# Patient Record
Sex: Male | Born: 1950 | Race: Black or African American | Hispanic: No | Marital: Married
Health system: Midwestern US, Academic
[De-identification: ages and names within clinical notes are randomized; demographics above are authoritative.]

---

## 2016-08-04 ENCOUNTER — Encounter: Admit: 2016-08-04 | Discharge: 2016-08-05 | Payer: MEDICARE

## 2016-08-04 DIAGNOSIS — R69 Illness, unspecified: Principal | ICD-10-CM

## 2016-08-09 ENCOUNTER — Encounter: Admit: 2016-08-09 | Discharge: 2016-08-10 | Payer: MEDICARE

## 2016-08-09 DIAGNOSIS — R69 Illness, unspecified: Principal | ICD-10-CM

## 2016-08-17 ENCOUNTER — Encounter: Admit: 2016-08-17 | Discharge: 2016-08-17 | Payer: MEDICARE

## 2016-08-17 ENCOUNTER — Ambulatory Visit: Admit: 2016-08-17 | Discharge: 2016-08-18 | Payer: MEDICARE

## 2016-08-17 DIAGNOSIS — I10 Essential (primary) hypertension: Principal | ICD-10-CM

## 2016-08-17 DIAGNOSIS — G20 Parkinson's disease: ICD-10-CM

## 2016-08-17 NOTE — Assessment & Plan Note
WHile he had improvement of his symptoms he was unable to tolerate CD/LD due to headaches and remains just on benztropine. He will remain on this and return to see me again in six months.

## 2016-08-17 NOTE — Progress Notes
Date of Service: 08/17/2016    Subjective:             Mitchell Burnett is a 66 y.o. male.    History of Present Illness    He returns today, accompanied by his wife, for follow up of his PD. He had improvement of his symptoms on CD/LD yet had headaches severe enough that he stopped the CD/LD. While on it he did not have problems with wearing off or of dyskinesias. He denies any sleep or behavior problems. He has rae falls at this time. Her tremor is well controlled on the benztropine and he denies any ill effects.    Review of Systems   Constitutional: Positive for activity change.   HENT: Negative.    Eyes: Negative.    Respiratory: Negative.    Cardiovascular: Negative.    Gastrointestinal: Negative.    Endocrine: Negative.    Genitourinary: Negative.    Musculoskeletal: Positive for back pain.        See a neurosurgeon later this month   Skin: Negative.    Allergic/Immunologic: Negative.    Neurological: Positive for tremors.   Hematological: Negative.    Psychiatric/Behavioral: Negative.    All other systems reviewed and are negative.    Medications:  ??? aspirin EC 81 mg tablet Take 81 mg by mouth daily. Take with food.   ??? benztropine (COGENTIN) 0.5 mg tablet TAKE ONE TABLET BY MOUTH TWICE DAILY FOR  TREMOR   ??? benztropine (COGENTIN) 0.5 mg tablet TAKE ONE TABLET BY MOUTH TWICE DAILY FOR  TREMOR   ??? benztropine (COGENTIN) 0.5 mg tablet One three times a day for tremor   ??? carbidopa/levodopa (SINEMET) 25/100 mg tablet 1 three times a day, just before meals Stopped in March 2018 due to headaches   ??? CORTISONE ACETATE (CORTISONE IM) Inject  to area(s) as directed as Needed. Patient receives these shots in the L and R shoulders   ??? cyclobenzaprine (FLEXERIL) 10 mg tablet Take 10 mg by mouth daily as needed.   ??? docusate (COLACE) 100 mg capsule Take 1 Cap by mouth daily as needed for Constipation.   ??? doxazosin (CARDURA) 8 mg tablet Take 8 mg by mouth at bedtime daily. ??? hydroCHLOROthiazide (HYDRODIURIL) 25 mg tablet Take 25 mg by mouth every morning.   ??? ibuprofen (MOTRIN) 800 mg tablet Take 1 Tab by mouth every 8 hours as needed for Pain.   ??? meloxicam (MOBIC) 7.5 mg tablet Take 15 mg by mouth daily.   ??? sildenafil(+) (VIAGRA) 50 mg tablet Take 1 Tab by mouth as Needed for Erectile dysfunction.   ??? vitamins, multiple (MULTIPLE VITAMINS DAILY) tablet Take 1 Tab by mouth daily.         Objective:           Vitals:    08/17/16 1026   BP: 129/80   Pulse: 71   Weight: 79.4 kg (175 lb)   Height: 180.3 cm (71)     Body mass index is 24.41 kg/m???.     Physical Exam    Well developed, muscular male accompanied by his wife.    UPDRS  Mentation, Behavior and Mood:    Primary source of information:: Patient and [place] in Equal Proportion  Intellectual Impairment: Normal  Thought Disorder: Normal  Depression: Normal  Motivation / Initiative: Normal  Total Mentation Score: 0      Activities of Daily Living:    Speech: Normal  Salivation: Normal  Swallowing: Normal  Handwriting: Normal  Cutting Food and Handling Utensils: Normal  Dressing: Normal  Turning in Bed and Adjusting Bed Clothes: Normal  Falling (Unrelated to Freezing): Normal  Freezing when Walking: Normal  Walking: Slight  Tremor (Right): Slight  Tremor (Left): Normal  Sensory Complaints Related to Parkinsonism: Normal  Total Activities of Daily Living Score: 2    Motor Examination:    Speech: Slight  Facial Expression: Slight  Tremor at Rest Jaw: Normal  Tremor at Rest RUE: Slight  Tremor at Rest LUE: Normal  Tremor at Rest RLE: Normal  Tremor at Rest LLE: Normal  Action of Postural Tremor of Hands R: Normal  Action of Postural Tremor of Hands L: Normal  Rigidity NECK: Normal  Rigidity RUE: Slight  Rigidity LUE: Normal  Rigidity RLE: Normal  Rigidity LLE: Normal  Finger Taps L: Normal  Hand Movements R: Slight  Hand Movements L: Normal  Rapid Alternating Movement of Hands R: Slight  Rapid Alternating Movements of Hands L: Normal Leg Agility with Knee Bent R: Normal  Leg Agility with Knee Bent L: Normal  Arising From Chair: Normal  Posture: Normal  Gait: Slight  Postural Stability: Normal (Not tesed due to recent back problems)  Body Bradykinesia and Hypokinesia: Slight  Total Motor Exam: 9    UPDRS Total Score:    Total Mentation Score: 0  Total Activities of Daily Living Score: 2  Total Motor Exam: 9  Total UPDRS Score: 11    Part 4:    Dyskinesia Duration: is 0%  Dyskinesia Disability: is 0%  Painful Dyskinesia: is 0%  Early AM Dystonia: is 0%  Predictable off period: is 0%  Unpredictable off period: is 0%  Off period suddenly: is 0%  Duration of off periods: is 0%  Total Part IV: 0  On Medications?: ON medications (benztropine only)    Additional Scores:    H&Y Stage: 1  [place] and [place] Percent Disability: 100 %         Assessment and Plan:    Problem   Parkinson Disease (Hcc)    Between his last visit in 2014 and January 2017 he developed right hand bradykinesia and increased tone making the diagnosis of PD clear. He has experienced side effects, primarily severe headaches, nausea, vomiting and hallucinations with various dopaminergic agents including CD/LD, Azilect, Neurpro and ropinirole.     He remains incredibly active and this has been the best treatment for him.            Parkinson disease (HCC)  WHile he had improvement of his symptoms he was unable to tolerate CD/LD due to headaches and remains just on benztropine. He will remain on this and return to see me again in six months.

## 2016-08-18 DIAGNOSIS — G20 Parkinson's disease: Principal | ICD-10-CM

## 2016-09-15 ENCOUNTER — Encounter: Admit: 2016-09-15 | Discharge: 2016-09-15 | Payer: MEDICARE

## 2016-09-15 MED ORDER — BENZTROPINE 0.5 MG PO TAB
ORAL_TABLET | Freq: Three times a day (TID) | ORAL | 5 refills | 30.00000 days | Status: AC
Start: 2016-09-15 — End: 2017-03-16

## 2017-02-17 ENCOUNTER — Ambulatory Visit: Admit: 2017-02-17 | Discharge: 2017-02-18 | Payer: MEDICARE

## 2017-02-17 ENCOUNTER — Encounter: Admit: 2017-02-17 | Discharge: 2017-02-17 | Payer: MEDICARE

## 2017-02-17 DIAGNOSIS — I10 Essential (primary) hypertension: Principal | ICD-10-CM

## 2017-02-17 DIAGNOSIS — R69 Illness, unspecified: Principal | ICD-10-CM

## 2017-02-17 DIAGNOSIS — G20 Parkinson's disease: ICD-10-CM

## 2017-02-18 DIAGNOSIS — M545 Low back pain: Principal | ICD-10-CM

## 2017-02-21 ENCOUNTER — Encounter: Admit: 2017-02-21 | Discharge: 2017-02-21 | Payer: MEDICARE

## 2017-02-21 ENCOUNTER — Ambulatory Visit: Admit: 2017-02-21 | Discharge: 2017-02-22 | Payer: MEDICARE

## 2017-02-21 ENCOUNTER — Ambulatory Visit: Admit: 2017-02-21 | Discharge: 2017-02-21 | Payer: MEDICARE

## 2017-02-21 DIAGNOSIS — M5416 Radiculopathy, lumbar region: Principal | ICD-10-CM

## 2017-03-16 ENCOUNTER — Encounter: Admit: 2017-03-16 | Discharge: 2017-03-16 | Payer: MEDICARE

## 2017-03-16 ENCOUNTER — Ambulatory Visit: Admit: 2017-03-16 | Discharge: 2017-03-17 | Payer: MEDICARE

## 2017-03-16 DIAGNOSIS — G20 Parkinson's disease: ICD-10-CM

## 2017-03-16 DIAGNOSIS — I10 Essential (primary) hypertension: Principal | ICD-10-CM

## 2017-03-16 MED ORDER — CARBIDOPA-LEVODOPA 25-100 MG PO TAB
ORAL_TABLET | Freq: Three times a day (TID) | 3 refills | Status: AC
Start: 2017-03-16 — End: 2017-09-13

## 2017-03-16 MED ORDER — BENZTROPINE 0.5 MG PO TAB
ORAL_TABLET | Freq: Two times a day (BID) | ORAL | 3 refills | 30.00000 days | Status: AC
Start: 2017-03-16 — End: 2018-05-05

## 2017-03-16 MED ORDER — NABUMETONE 750 MG PO TAB
ORAL_TABLET | Freq: Every day | ORAL | 3 refills | 30.00000 days | Status: AC
Start: 2017-03-16 — End: 2018-04-03

## 2017-03-17 DIAGNOSIS — G20 Parkinson's disease: Principal | ICD-10-CM

## 2017-03-17 DIAGNOSIS — M48061 Spinal stenosis, lumbar region without neurogenic claudication: ICD-10-CM

## 2017-05-16 ENCOUNTER — Ambulatory Visit: Admit: 2017-05-16 | Discharge: 2017-05-17 | Payer: MEDICARE

## 2017-05-16 ENCOUNTER — Encounter: Admit: 2017-05-16 | Discharge: 2017-05-16 | Payer: MEDICARE

## 2017-05-16 ENCOUNTER — Ambulatory Visit: Admit: 2017-05-16 | Discharge: 2017-05-16 | Payer: MEDICARE

## 2017-05-16 DIAGNOSIS — M5416 Radiculopathy, lumbar region: Principal | ICD-10-CM

## 2017-05-17 ENCOUNTER — Encounter: Admit: 2017-05-17 | Discharge: 2017-05-17 | Payer: MEDICARE

## 2017-08-23 ENCOUNTER — Ambulatory Visit: Admit: 2017-08-23 | Discharge: 2017-08-24 | Payer: MEDICARE

## 2017-08-23 ENCOUNTER — Encounter: Admit: 2017-08-23 | Discharge: 2017-08-23 | Payer: MEDICARE

## 2017-08-23 DIAGNOSIS — M5136 Other intervertebral disc degeneration, lumbar region: Secondary | ICD-10-CM

## 2017-08-23 DIAGNOSIS — G20 Parkinson's disease: ICD-10-CM

## 2017-08-23 DIAGNOSIS — I10 Essential (primary) hypertension: Principal | ICD-10-CM

## 2017-08-23 MED ORDER — GABAPENTIN 100 MG PO CAP
ORAL_CAPSULE | 0 refills | Status: AC
Start: 2017-08-23 — End: 2017-10-11

## 2017-08-24 DIAGNOSIS — M47816 Spondylosis without myelopathy or radiculopathy, lumbar region: ICD-10-CM

## 2017-08-24 DIAGNOSIS — M5416 Radiculopathy, lumbar region: Principal | ICD-10-CM

## 2017-09-05 ENCOUNTER — Encounter: Admit: 2017-09-05 | Discharge: 2017-09-05 | Payer: MEDICARE

## 2017-09-05 ENCOUNTER — Ambulatory Visit: Admit: 2017-09-05 | Discharge: 2017-09-05 | Payer: MEDICARE

## 2017-09-05 DIAGNOSIS — M5416 Radiculopathy, lumbar region: Principal | ICD-10-CM

## 2017-09-05 DIAGNOSIS — M5136 Other intervertebral disc degeneration, lumbar region: ICD-10-CM

## 2017-09-05 DIAGNOSIS — M47816 Spondylosis without myelopathy or radiculopathy, lumbar region: ICD-10-CM

## 2017-09-05 MED ORDER — BUPIVACAINE (PF) 0.25 % (2.5 MG/ML) IJ SOLN
2 mL | Freq: Once | INTRAMUSCULAR | 0 refills | Status: CP
Start: 2017-09-05 — End: ?

## 2017-09-05 MED ORDER — METHYLPREDNISOLONE ACETATE 80 MG/ML IJ SUSP
80 mg | Freq: Once | INTRAMUSCULAR | 0 refills | Status: CP
Start: 2017-09-05 — End: ?

## 2017-09-05 MED ORDER — IOPAMIDOL 41 % IT SOLN
2.5 mL | Freq: Once | EPIDURAL | 0 refills | Status: CP
Start: 2017-09-05 — End: ?

## 2017-09-13 ENCOUNTER — Ambulatory Visit: Admit: 2017-09-13 | Discharge: 2017-09-14 | Payer: MEDICARE

## 2017-09-13 ENCOUNTER — Encounter: Admit: 2017-09-13 | Discharge: 2017-09-13 | Payer: MEDICARE

## 2017-09-13 DIAGNOSIS — G20 Parkinson's disease: ICD-10-CM

## 2017-09-13 DIAGNOSIS — I10 Essential (primary) hypertension: Principal | ICD-10-CM

## 2017-09-13 MED ORDER — CARBIDOPA-LEVODOPA 25-100 MG PO TAB
ORAL_TABLET | Freq: Three times a day (TID) | 3 refills | Status: AC
Start: 2017-09-13 — End: 2018-05-26

## 2017-09-14 DIAGNOSIS — G20 Parkinson's disease: Principal | ICD-10-CM

## 2017-09-19 ENCOUNTER — Encounter: Admit: 2017-09-19 | Discharge: 2017-09-19 | Payer: MEDICARE

## 2017-10-11 ENCOUNTER — Encounter: Admit: 2017-10-11 | Discharge: 2017-10-11 | Payer: MEDICARE

## 2017-10-11 MED ORDER — GABAPENTIN 100 MG PO CAP
ORAL_CAPSULE | 3 refills | Status: AC
Start: 2017-10-11 — End: 2018-04-21

## 2017-11-07 ENCOUNTER — Encounter: Admit: 2017-11-07 | Discharge: 2017-11-07 | Payer: MEDICARE

## 2017-11-07 ENCOUNTER — Ambulatory Visit: Admit: 2017-11-07 | Discharge: 2017-11-07 | Payer: MEDICARE

## 2017-11-07 DIAGNOSIS — M5416 Radiculopathy, lumbar region: Principal | ICD-10-CM

## 2017-11-07 DIAGNOSIS — G20 Parkinson's disease: ICD-10-CM

## 2017-11-07 DIAGNOSIS — I10 Essential (primary) hypertension: Principal | ICD-10-CM

## 2017-11-07 MED ORDER — BUPIVACAINE 0.25 % (2.5 MG/ML) IJ SOLN
50 mL | Freq: Once | INTRAMUSCULAR | 0 refills | Status: CP
Start: 2017-11-07 — End: ?

## 2017-11-07 MED ORDER — METHYLPREDNISOLONE ACETATE 40 MG/ML IJ SUSP
80 mg | Freq: Once | INTRAMUSCULAR | 0 refills | Status: CP
Start: 2017-11-07 — End: ?

## 2017-11-07 MED ORDER — OTHER MEDICATION
1 | Freq: Once | INTRAMUSCULAR | 0 refills | Status: CP
Start: 2017-11-07 — End: ?

## 2018-02-02 ENCOUNTER — Encounter: Admit: 2018-02-02 | Discharge: 2018-02-02 | Payer: MEDICARE

## 2018-02-02 DIAGNOSIS — M5416 Radiculopathy, lumbar region: Principal | ICD-10-CM

## 2018-02-20 ENCOUNTER — Encounter: Admit: 2018-02-20 | Discharge: 2018-02-20 | Payer: MEDICARE

## 2018-02-20 ENCOUNTER — Ambulatory Visit: Admit: 2018-02-20 | Discharge: 2018-02-20 | Payer: MEDICARE

## 2018-02-20 DIAGNOSIS — M5416 Radiculopathy, lumbar region: Secondary | ICD-10-CM

## 2018-02-20 DIAGNOSIS — M25572 Pain in left ankle and joints of left foot: Secondary | ICD-10-CM

## 2018-02-20 DIAGNOSIS — G20 Parkinson's disease: Secondary | ICD-10-CM

## 2018-02-20 DIAGNOSIS — I10 Essential (primary) hypertension: Secondary | ICD-10-CM

## 2018-03-20 ENCOUNTER — Encounter: Admit: 2018-03-20 | Discharge: 2018-03-20 | Payer: MEDICARE

## 2018-03-20 DIAGNOSIS — M25572 Pain in left ankle and joints of left foot: Principal | ICD-10-CM

## 2018-03-21 ENCOUNTER — Ambulatory Visit: Admit: 2018-03-21 | Discharge: 2018-03-21 | Payer: MEDICARE

## 2018-03-21 ENCOUNTER — Encounter: Admit: 2018-03-21 | Discharge: 2018-03-21 | Payer: MEDICARE

## 2018-03-21 DIAGNOSIS — M25572 Pain in left ankle and joints of left foot: Principal | ICD-10-CM

## 2018-03-21 DIAGNOSIS — M76829 Posterior tibial tendinitis, unspecified leg: ICD-10-CM

## 2018-03-21 DIAGNOSIS — G8929 Other chronic pain: ICD-10-CM

## 2018-03-21 DIAGNOSIS — I10 Essential (primary) hypertension: Principal | ICD-10-CM

## 2018-03-21 DIAGNOSIS — G20 Parkinson's disease: ICD-10-CM

## 2018-03-21 NOTE — Progress Notes
Staff name:  Abran Duke, MD Date:  03/21/2018          Abran Duke, MD    Portions of this noted may have been created using Dragon, a voice recognition software.  Please contact my office for any clarification of documentation    Please send a copy of office notes to the primary care physician and referring providers

## 2018-04-03 ENCOUNTER — Encounter: Admit: 2018-04-03 | Discharge: 2018-04-03 | Payer: MEDICARE

## 2018-04-03 MED ORDER — NABUMETONE 750 MG PO TAB
ORAL_TABLET | Freq: Every day | ORAL | 1 refills | 30.00000 days | Status: AC
Start: 2018-04-03 — End: 2018-10-13

## 2018-04-21 ENCOUNTER — Encounter: Admit: 2018-04-21 | Discharge: 2018-04-21 | Payer: MEDICARE

## 2018-04-21 MED ORDER — GABAPENTIN 100 MG PO CAP
ORAL_CAPSULE | 3 refills | Status: AC
Start: 2018-04-21 — End: 2018-05-26

## 2018-05-03 ENCOUNTER — Encounter: Admit: 2018-05-03 | Discharge: 2018-05-03 | Payer: MEDICARE

## 2018-05-05 MED ORDER — BENZTROPINE 0.5 MG PO TAB
ORAL_TABLET | Freq: Two times a day (BID) | ORAL | 1 refills | 30.00000 days | Status: AC
Start: 2018-05-05 — End: 2018-12-04

## 2018-05-16 ENCOUNTER — Encounter: Admit: 2018-05-16 | Discharge: 2018-05-16 | Payer: MEDICARE

## 2018-05-26 ENCOUNTER — Ambulatory Visit: Admit: 2018-05-26 | Discharge: 2018-05-27 | Payer: MEDICARE

## 2018-05-26 ENCOUNTER — Encounter: Admit: 2018-05-26 | Discharge: 2018-05-26 | Payer: MEDICARE

## 2018-05-26 DIAGNOSIS — M48062 Spinal stenosis, lumbar region with neurogenic claudication: ICD-10-CM

## 2018-05-26 DIAGNOSIS — I10 Essential (primary) hypertension: Principal | ICD-10-CM

## 2018-05-26 DIAGNOSIS — G20 Parkinson's disease: ICD-10-CM

## 2018-05-26 MED ORDER — CARBIDOPA-LEVODOPA 25-100 MG PO TAB
ORAL_TABLET | Freq: Three times a day (TID) | 3 refills | Status: DC
Start: 2018-05-26 — End: 2019-07-10

## 2018-05-26 MED ORDER — GABAPENTIN 100 MG PO CAP
ORAL_CAPSULE | Freq: Three times a day (TID) | 1 refills | Status: DC
Start: 2018-05-26 — End: 2018-09-25

## 2018-05-26 NOTE — Progress Notes
Telephone Visit Note    Date of Service: 05/26/2018    Subjective:      Obtained patient's verbal consent to treat them and their agreement to [facility] financial policy and NPP via this telehealth visit during the [facility]       Mitchell Burnett is a 68 y.o. male.    History of Present Illness    This telephone visit was for follow up of his PD and chronic low back pain. He is less active now with the shelter in place order. He notices wearing off of his CD/LD which he takes morning and at bedtime and his wife has noticed some tremor at night. He has not had any dyskinesias or behavior problems and has had only one fall.     Review of Systems   Neurological: Positive for tremors (evening worsening).   All other systems reviewed and are negative.    Medications:  ??? benztropine (COGENTIN) 0.5 mg tablet TAKE 1 TABLET BY MOUTH TWICE DAILY FOR  TREMOR   ??? carbidopa/levodopa (SINEMET) 25/100 mg tablet One and 1/2 pills three times a day, just before meals (Patient taking differently: One and 1/2 pills twice times a day, just before meals)   ??? CORTISONE ACETATE (CORTISONE IM) Inject  to area(s) as directed as Needed. Patient receives these shots in the L and R shoulders   ??? doxazosin (CARDURA) 8 mg tablet Take 8 mg by mouth at bedtime daily.   ??? gabapentin (NEURONTIN) 100 mg capsule 1 po qhs x 1 week then 2 po QHS x 1 week then 3 po QHS thereafter  Indications: neuropathic pain (Patient taking differently: Take 100 mg by mouth twice daily. 1 po qhs x 1 week then 2 po QHS x 1 week then 3 po QHS thereafter  Indications: neuropathic pain)   ??? hydroCHLOROthiazide (HYDRODIURIL) 25 mg tablet Take 25 mg by mouth every morning.   ??? nabumetone (RELAFEN) 750 mg tablet TAKE 2 TABLETS BY MOUTH DAILY *REPLACES MELOXICAM*   ??? rosuvastatin (CRESTOR) 10 mg tablet Take 10 mg by mouth daily.   ??? sildenafil(+) (VIAGRA) 50 mg tablet Take 1 Tab by mouth as Needed for Erectile dysfunction.       Objective: Order Specific Question:   Collaborating Provider     Answer:   Rudell Cobb [[id number]]                  Total time 20 minutes.

## 2018-05-26 NOTE — Assessment & Plan Note
While he has some benefit it is wearing off and he is taking his medication twice a day, morning and bed time.    He will resume three times a day dosing before meals and will return to see me again in three months

## 2018-07-11 ENCOUNTER — Encounter: Admit: 2018-07-11 | Discharge: 2018-07-11

## 2018-07-11 NOTE — Telephone Encounter
This should be directed to his PCP

## 2018-07-11 NOTE — Telephone Encounter
Pt calls in stating he would like a refill on viagra. Dr. Hetty Ely last prescribed in 2017. Routed to Dr. Hetty Ely for approval.

## 2018-07-12 NOTE — Telephone Encounter
Spoke with pt, he will speak with PCP.

## 2018-09-22 ENCOUNTER — Encounter: Admit: 2018-09-22 | Discharge: 2018-09-22

## 2018-09-22 DIAGNOSIS — S92909S Unspecified fracture of unspecified foot, sequela: Secondary | ICD-10-CM

## 2018-09-22 NOTE — Telephone Encounter
Received call from pt stating he would like to have referral for orthopedic surgeon at [place] for his foot (pt states he broke it 2-3 years ago). Pt states he has a limp when he walks and pain in his foot.     Pt also states he has not received any benefit from gabapentin. Pt is currently only taking 100mg  TID as 'it knocks me out". He would like to know if he can try something else since he is not receiving relief.

## 2018-09-22 NOTE — Telephone Encounter
Referral to orthopedic surgery placed, He will need to obtain the old X-ray studies    Please have him stop the gabapentin and try to take his CD/LD three times a day to see if that helps with the pain

## 2018-09-25 ENCOUNTER — Encounter: Admit: 2018-09-25 | Discharge: 2018-09-25 | Payer: MEDICARE

## 2018-09-25 ENCOUNTER — Ambulatory Visit: Admit: 2018-09-25 | Discharge: 2018-09-25 | Payer: MEDICARE

## 2018-09-25 DIAGNOSIS — M5416 Radiculopathy, lumbar region: Secondary | ICD-10-CM

## 2018-09-25 DIAGNOSIS — M48062 Spinal stenosis, lumbar region with neurogenic claudication: Principal | ICD-10-CM

## 2018-09-25 MED ORDER — LIDOCAINE (PF) 10 MG/ML (1 %) IJ SOLN
2 mL | Freq: Once | INTRAMUSCULAR | 0 refills | Status: AC
Start: 2018-09-25 — End: ?

## 2018-09-25 MED ORDER — IOHEXOL 300 MG IODINE/ML IV SOLN
2 mL | Freq: Once | 0 refills | Status: AC
Start: 2018-09-25 — End: ?

## 2018-09-25 MED ORDER — METHYLPREDNISOLONE ACETATE 40 MG/ML IJ SUSP
40 mg | Freq: Once | INTRAMUSCULAR | 0 refills | Status: AC
Start: 2018-09-25 — End: ?

## 2018-09-25 NOTE — Telephone Encounter
Spoke with pt regarding medication changes and placement of referral. pts states he he is not having pain. He does not want to change his CD/LD at this time. He states she will stop the gabapentin. He state the only pain he is having is in his foot.

## 2018-09-25 NOTE — Procedures
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 Lumbar radiculopathy        Post-Procedure Diagnosis:   1. Spinal stenosis of lumbar region with neurogenic claudication    2. Lumbar radiculopathy        SNR/TF LMBR/SAC Injection  Procedure: transforaminal epidural    Laterality: bilateral    Location: lumbar - L4-5      Consent:   Consent obtained: verbal and written  Consent given by: patient  Risks discussed: allergic reaction, bleeding, bruising and infection  Alternatives discussed: alternative treatment, delayed treatment and no treatment  Discussed with patient the purpose of the treatment/procedure, other ways of treating my condition, including no treatment/ procedure and the risks and benefits of the alternatives. Patient has decided to proceed with treatment/procedure.        Universal Protocol:  Relevant documents: relevant documents present and verified  Test results: test results available and properly labeled  Imaging studies: imaging studies available  Required items: required blood products, implants, devices, and special equipment available  Site marked: the operative site was marked  Patient identity confirmed: Patient identify confirmed verbally with patient.        Time out: Immediately prior to procedure a time out was called to verify the correct patient, procedure, equipment, support staff and site/side marked as required      Procedures Details:   Indications: pain and diagnostic evaluation   Prep: Betadine  Patient position: prone  Estimated Blood Loss: minimal  Specimens: none  Number of Levels: 2  Approach: right paramedian  Guidance: fluoroscopy  Contrast: Procedure confirmed with contrast under live fluoroscopy.  Needle and Epidural Catheter: pencil-tip  Needle size: 25 G  Injection procedure: Introduced needle Patient tolerance: Patient tolerated the procedure well with no immediate complications. Pressure was applied, and hemostasis was accomplished.  Outcome: Pain improved  Comments: INTERVENTIONAL PAIN MANAGEMENT PROCEDURE REPORT    Lumbar Transforaminal Epidural Injection    Date of Service: @ED @    Procedure Title(s):    1. Bilateral L4-L5 transforaminal epidural injection   2. Intraoperative fluoroscopy     Attending Surgeon: @ENCPROVNMTITLE @    Anesthesia: Local           Anxiolysis No           Procedural Sedation No    Indications: Mitchell Burnett is a 68 y.o. male with a diagnosis of lumbar radicular pain, disc bulge. The patient's history and physical exam were reviewed. The risks, benefits and alternatives to the procedure were discussed, and all questions were answered to the patient's satisfaction. The patient agreed to proceed, and written informed consent was obtained.     Procedure in Detail: IV was started? No    The patient was brought into the procedure room and placed in the prone position on the fluoroscopy table. Standard monitors were placed, and vital signs were observed throughout the procedure. The area of the lumbar spine was prepped with betadine and draped in a sterile manner. The L4-L5 vertebral bodies were identified with AP fluoroscopy. An oblique view to the right then right  was obtained to better visualize the inferior junction of the pedicle and transverse process. The 6 o???clock position of the pedicle was marked and identified. The skin and subcutaneous tissues in the area were anesthetized with 1% lidocaine. A 25-gauge, 3.5 inch needle was directed toward the  targeted point under fluoroscopy until bone was contacted. The needle was then walked inferiorly until the neural foramen was entered. A lateral fluoroscopic view was then used to place the needle tip at the 10 o???clock position of the foramen.     Negative aspiration was confirmed, and 1ml of contrast was injected at each level. Appropriate neurograms were observed under live AP fluoroscopy with no noted vascular or intrathecal uptake. Then, after negative aspiration, a solution consisting of 1mL 0.25% bupivacaine and  40 (steroid) mg was easily injected at each level. The needles were removed with a 1% lidocaine flush. The patient???s back was cleaned and a bandage was placed over the needle insertion points.    The same procedure was performed on the opposite side? Yes    Disposition: The patient tolerated the procedure well, and there were no apparent complications. Vital signs remained stable througtout the procedure. The patient was taken to the recovery area where discharge instructions for the procedure were given.     Estimated Blood Loss: minimal    Specimens: none    Complications: none        Estimated blood loss: none or minimal  Specimens: none  Patient tolerated the procedure well with no immediate complications. Pressure was applied, and hemostasis was accomplished.

## 2018-09-25 NOTE — Progress Notes
[facility]  INTERVENTIONAL PAIN PROCEDURE HISTORY AND PHYSICAL    No chief complaint on file.      HISTORY OF PRESENT ILLNESS:  pain    Medical History:   Diagnosis Date   ??? Hypertension    ??? Parkinson disease [facility])        Surgical History:   Procedure Laterality Date   ??? SHOULDER SURGERY  1983    Right- dislocated   ??? CERVICAL SPINE SURGERY  2009   ??? COLONOSCOPY         family history includes Cancer in his brother and mother.    Social History     Socioeconomic History   ??? Marital status: Married     Spouse name: Not on file   ??? Number of children: Not on file   ??? Years of education: Not on file   ??? Highest education level: Not on file   Occupational History   ??? Not on file   Tobacco Use   ??? Smoking status: Never Smoker   ??? Smokeless tobacco: Never Used   Substance and Sexual Activity   ??? Alcohol use: Yes     Alcohol/week: 0.0 standard drinks     Comment: 2-3 drinks per week   ??? Drug use: No   ??? Sexual activity: Not on file   Other Topics Concern   ??? Not on file   Social History Narrative   ??? Not on file       Allergies   Allergen Reactions   ??? Pcn [Penicillins] UNKNOWN       Vitals:    09/25/18 1051 09/25/18 1131   BP:  122/87   Temp: 36.7 ???C (98.1 ???F)    SpO2:  93%   Weight:  84.8 kg (187 lb)   Height:  177.8 cm (70)       REVIEW OF SYSTEMS: 10 point ROS obtained and negative except pain      PHYSICAL EXAM:General Appearance: moderately overweight, no distress     Respiratory Effort: breathing comfortably, no respiratory distress   Pedal Pulses: normal symmetric pedal pulses   Lower Extremity Edema: no lower extremity edema   Abdominal Exam: mildly protuberant but soft, non-tender, no masses, bowel sounds normal   Gait & Station: walks without assistance   Muscle Strength: normal muscle tone   Orientation: oriented to time, place and person   Affect & Mood: appropriate and sustained affect   Language and Memory: patient responsive and seems to comprehend information Neurologic Exam: neurological assessment grossly intact   Other: moves all extremities          IMPRESSION:    1. Spinal stenosis of lumbar region with neurogenic claudication    2. Lumbar radiculopathy         PLAN: Lumbar Transforaminal Steroid Injection

## 2018-09-25 NOTE — Discharge Instructions - Supplementary Instructions
..  GENERAL POST PROCEDURE INSTRUCTIONS    Time:____________________  Physician:_________________________________    Procedure Completed Today:  o Joint Injection (hip, knee, shoulder)  o Cervical Epidural Steroid Injection  o Cervical Transforaminal Steroid Injection  o Trigger Point Injection  o Other___________________________ o Thoracic Epidural Steroid Injection  o Lumbar Epidural Steroid Injection  o Lumbar Transforaminal Steroid Injection  o Facet Joint Injection     Important information following your procedure today:  ? You may drive today     ? If you had sedation ,you may NOT drive today  ? Rest at home for the next 6 hours.  You may then begin to resume your normal activities.  ? DO NOT drive any vehicle, operate any power tools, drink alcohol, make any major decisions, or sign any legal documents for the next 12 hours.  1. Pain relief may not be immediate. It is possible you may even experience an increase in pain during the first 24-48 hours followed by a gradual decrease of your pain.  2. Though the procedure is generally safe and complications are rare, we do ask that you be aware of any of the following:  ? Any swelling, persistent redness, new bleeding or drainage from the site of the injection.  ? You should not experience a severe headache.  ? You should not run a fever over 101oF.  ? New onset of sharp, severe back and or neck pain.  ? New onset of upper or lower extremity numbness or weakness.  ? New difficulty controlling bowel or bladder function after injection.  ? New shortness of breath.  If any of these occur, please call to report this occurrence to a nurse at [phone]. If you are calling after 4:00pm, on the weekends or holidays please call [phone] and ask to have the pain management resident physician on call for the physician paged or go to your local emergency room.   3. You may experience soreness at the injection site. Ice can be applied at 20 minute intervals for the first 24 hours. The following day you may alternate ice with heat if you are experiencing muscle tightness, otherwise continue with ice. Ice works best at decreasing pain. Avoid application of direct heat, hot showers or hot tubs today.  4. Avoid strenuous activity today. You many resume your regular activities and exercise tomorrow.  5. Patients with diabetes may see an elevation in blood sugars for 7-10 days after the injection. It is important to pay close attention to you diet, check your blood sugars daily and repost extreme elevations to the physician that treats your diabetes.  6. Patients taking daily blood thinners can resume their regular dose this evening.  7. It is important that you take all medications ordered by your pain physician. Taking medications as ordered is an important part of you pain care plan. If you cannot continue the medication plan, please notify the physician.  Possible side effects to steroids that may occur:  ? Flushing or redness of the face  ? Irritability  ? Fluid retention  ? Change in women's menses  Follow up appointment as needed if in the event you are unable to keep an appointment please notify the scheduler 24 hours in advance at [phone].    ____________________________________  ____________________________________

## 2018-09-25 NOTE — Telephone Encounter
-----   Message from Jerilynn Som, MD sent at 09/25/2018 11:57 AM CDT -----  Repeat Bilat L45 TF ESI in 4 months please

## 2018-10-12 ENCOUNTER — Encounter: Admit: 2018-10-12 | Discharge: 2018-10-12

## 2018-10-13 MED ORDER — NABUMETONE 750 MG PO TAB
ORAL_TABLET | Freq: Every day | ORAL | 0 refills | 30.00000 days | Status: DC
Start: 2018-10-13 — End: 2019-01-10

## 2018-10-13 NOTE — Telephone Encounter
Refill request received for nabumetone 750mg . LOV 05/26/18. Routed to Dr. Evette Cristal for review, as plan of care does not specify continuation.

## 2018-11-07 ENCOUNTER — Encounter

## 2018-11-07 DIAGNOSIS — M25572 Pain in left ankle and joints of left foot: Secondary | ICD-10-CM

## 2018-11-07 DIAGNOSIS — M79672 Pain in left foot: Secondary | ICD-10-CM

## 2018-11-14 ENCOUNTER — Encounter: Admit: 2018-11-14 | Discharge: 2018-11-14 | Payer: MEDICARE

## 2018-11-17 ENCOUNTER — Encounter: Admit: 2018-11-17 | Discharge: 2018-11-17 | Payer: MEDICARE

## 2018-11-17 DIAGNOSIS — I10 Essential (primary) hypertension: Secondary | ICD-10-CM

## 2018-11-17 DIAGNOSIS — G20 Parkinson's disease: Secondary | ICD-10-CM

## 2018-11-20 ENCOUNTER — Encounter: Admit: 2018-11-20 | Discharge: 2018-11-20 | Payer: MEDICARE

## 2018-11-20 DIAGNOSIS — Z1159 Encounter for screening for other viral diseases: Secondary | ICD-10-CM

## 2018-11-20 DIAGNOSIS — M2142 Flat foot [pes planus] (acquired), left foot: Secondary | ICD-10-CM

## 2018-11-20 DIAGNOSIS — M21612 Bunion of left foot: Secondary | ICD-10-CM

## 2018-11-20 NOTE — Pre-Anesthesia Patient Instructions
GENERAL INFORMATION    Before you come to the hospital  Make arrangements for a responsible adult to drive you home and stay with you for 24 hours following surgery.  Bath/Shower Instructions        DIAL BAR SOAP  Take a bath or shower with antibacterial soap the night before or the morning of your procedure. Use clean towels.  Put on clean clothes after bath or shower.  Avoid using lotion and oils.  If you are having surgery above the waist, wear a shirt that fastens up the front.  Sleep on clean sheets if bath or shower is done the night before procedure.  Leave money, credit cards, jewelry, and any other valuables at home. The [facility] is not responsible for the loss or breakage of personal items.  Remove nail polish, makeup and all jewelry (including piercings) before coming to the hospital.  The morning of your procedure:  brush your teeth and tongue  do not smoke  do not shave the area where you will have surgery    What to bring to the hospital  ID/ Insurance Card  Medical Device card  Official documents for legal guardianship   Copy of your Living Will, Advanced Directives, and/or Durable Power of Attorney   Small bag with a few personal belongings  Cases for glasses/hearing aids/contact lens (bring solutions for contacts)  Dress in clean, loose, comfortable clothing     Eating or drinking before surgery  Do not eat or drink anything after 11:00 p.m. the day before your procedure (including gum, mints, candy, or chewing tobacco) OR follow the specific instructions you were given by your Surgeon.  You may have WATER ONLY up to 2 hours before arriving at the hospital.     Other instructions  Notify your surgeon if:  you become ill with a cough, fever, sore throat, nausea, vomiting or flu-like symptoms  you have any open wounds/sores that are red, painful, draining, or are new since you last saw  the doctor  you need to cancel your procedure You will receive a call with your surgery arrival time from between 2:30pm and 4:30pm the last business day before your procedure.  If you do not receive a call, please call [phone] before 4:30pm or [phone] after 4:30pm.    Notify us at [facility]: [phone]  if you need to cancel your procedure  if you are going to be late    Arrival at the hospital  [facility]  [address]  [place], [place] [address]    Park in the [employer], located directly across from the main entrance to the hospital.  Judee Clara parking is available  from 7 AM to 4 PM Monday through Friday.  Enter through the ground floor main hospital entrance and check in at the Information Desk in the lobby.  They will validate your parking ticket and direct you to the next location.  Validated parking tickets cost $3.    For the safety of all patients, visitors and staff as we work to contain COVID-19, we must dramatically restrict patient visitors.      Current Visitor Policy (07/13/18):  One visitor per patient per day.  Exceptions include:    Two parents/guardian for patients younger than 21.  End-of-life patients may be allowed additional support persons.  Visitors should check with the patient's nurse.  Only cancer patients at their exam visit may have one visitor with them.  No visitors are allowed for cancer patients receiving treatment/infusion services.  This applies at all cancer center locations.  Restrictions still apply for patients who test positive for COVID-19 (no visitors).    Visitors will continue to be screened at all entrances.  They must be free of fever and symptoms to be in our facilities.  We ask visitors to follow these guidelines:  Wear a mask at all times.  Go directly to the nursing station in the unit you are visiting and do not linger in public areas.  Check in at the nursing station before going to the patient's room.  Maintain a physical distance of six feet from all others. Follow elevator restrictions to four riding at a time - peak times are 6:30-7:30 a.m., noon and 6:30-7:30 p.m.  Be aware cafeteria peak times are 11 a.m. - 1 p.m.  Wash your hands frequently and cover your coughs and sneezes.

## 2018-11-20 NOTE — Progress Notes
[facility] Phone Triage complete.  Sgy10/26/20 with Dr.Horton.  Pt reports PMHx: denies any cardiac or pulmonary hx except HTN, HLD, Parkinson's, OAetc. Pt denies any PONV or Vocal Cord, Anesthesia complication hx.  Functionality: pt reports able to climb stairs w/o cp, soa.  Pt denies cp, soa, open wounds/rash, fever, cough, cold/flu s/s presently.  Pt does not meet [facility] clinic appt criteria presently.    Preoperative and Medication instructions reviewed.  See AVS Pre Procedure Medications summary for medication instructions.  Day before sgy NPO after 2300, may drink water until 2 hours before hospital report time.  Pt verbalizes understanding of instructions and requests a copy be e-mailed.  Pt reminded to report to [facility] with driver.    Explained current Hospital's no visitors policy regarding sgy and inpts.  Verbalized understanding, questions answered.  Informed everyone must wear masks while in the hospital, may wear personal mask, or masks will be available at [place].    COVID19 testing is coordinated thru Surgeon's office.

## 2018-12-01 ENCOUNTER — Encounter: Admit: 2018-12-01 | Discharge: 2018-12-02 | Payer: MEDICARE

## 2018-12-01 DIAGNOSIS — Z1159 Encounter for screening for other viral diseases: Secondary | ICD-10-CM

## 2018-12-02 ENCOUNTER — Encounter: Admit: 2018-12-02 | Discharge: 2018-12-02 | Payer: MEDICARE

## 2018-12-02 LAB — COVID-19 (SARS-COV-2) PCR

## 2018-12-03 ENCOUNTER — Encounter: Admit: 2018-12-03 | Discharge: 2018-12-03 | Payer: MEDICARE

## 2018-12-04 ENCOUNTER — Encounter: Admit: 2018-12-04 | Discharge: 2018-12-04 | Payer: MEDICARE

## 2018-12-04 DIAGNOSIS — I10 Essential (primary) hypertension: Secondary | ICD-10-CM

## 2018-12-04 DIAGNOSIS — G20 Parkinson's disease: Secondary | ICD-10-CM

## 2018-12-04 MED ORDER — LIDOCAINE (PF) 10 MG/ML (1 %) IJ SOLN
SUBCUTANEOUS | 0 refills | Status: CP
Start: 2018-12-04 — End: ?
  Administered 2018-12-04: 13:00:00 2 mL via SUBCUTANEOUS

## 2018-12-04 MED ORDER — DOCUSATE SODIUM 100 MG PO CAP
100 mg | Freq: Two times a day (BID) | ORAL | 0 refills | Status: DC
Start: 2018-12-04 — End: 2018-12-05
  Administered 2018-12-05 (×2): 100 mg via ORAL

## 2018-12-04 MED ORDER — HYDROMORPHONE (PF) 2 MG/ML IJ SYRG
.5-1 mg | INTRAVENOUS | 0 refills | Status: DC | PRN
Start: 2018-12-04 — End: 2018-12-04

## 2018-12-04 MED ORDER — BENZTROPINE 1 MG PO TAB
.5 mg | Freq: Two times a day (BID) | ORAL | 0 refills | Status: DC
Start: 2018-12-04 — End: 2018-12-05
  Administered 2018-12-05 (×2): 0.5 mg via ORAL

## 2018-12-04 MED ORDER — ACETAMINOPHEN 650 MG RE SUPP
650 mg | RECTAL | 0 refills | Status: DC | PRN
Start: 2018-12-04 — End: 2018-12-05

## 2018-12-04 MED ORDER — BISACODYL 10 MG RE SUPP
10 mg | Freq: Every day | RECTAL | 0 refills | Status: DC | PRN
Start: 2018-12-04 — End: 2018-12-05

## 2018-12-04 MED ORDER — FENTANYL CITRATE (PF) 50 MCG/ML IJ SOLN
25-50 ug | INTRAVENOUS | 0 refills | Status: DC | PRN
Start: 2018-12-04 — End: 2018-12-04

## 2018-12-04 MED ORDER — HYDROCHLOROTHIAZIDE 25 MG PO TAB
25 mg | Freq: Every morning | ORAL | 0 refills | Status: DC
Start: 2018-12-04 — End: 2018-12-05
  Administered 2018-12-05: 14:00:00 25 mg via ORAL

## 2018-12-04 MED ORDER — BETAMETHASONE ACET,SOD PHOS 6 MG/ML IJ SUSP
0 refills | Status: DC
Start: 2018-12-04 — End: 2018-12-04
  Administered 2018-12-04: 16:00:00 2 mg via INTRA_ARTICULAR

## 2018-12-04 MED ORDER — ONDANSETRON HCL (PF) 4 MG/2 ML IJ SOLN
4 mg | Freq: Once | INTRAVENOUS | 0 refills | Status: DC | PRN
Start: 2018-12-04 — End: 2018-12-04

## 2018-12-04 MED ORDER — ACETAMINOPHEN 500 MG PO TAB
1000 mg | Freq: Once | ORAL | 0 refills | Status: CP
Start: 2018-12-04 — End: ?
  Administered 2018-12-04: 12:00:00 1000 mg via ORAL

## 2018-12-04 MED ORDER — DOXAZOSIN 8 MG PO TAB
8 mg | Freq: Every evening | ORAL | 0 refills | Status: DC
Start: 2018-12-04 — End: 2018-12-05
  Administered 2018-12-05: 01:00:00 8 mg via ORAL

## 2018-12-04 MED ORDER — PROPOFOL INJ 10 MG/ML IV VIAL
0 refills | Status: DC
Start: 2018-12-04 — End: 2018-12-04

## 2018-12-04 MED ORDER — LIDOCAINE (PF) 200 MG/10 ML (2 %) IJ SYRG
0 refills | Status: DC
Start: 2018-12-04 — End: 2018-12-04
  Administered 2018-12-04: 13:00:00 80 mg via INTRAVENOUS

## 2018-12-04 MED ORDER — ROPIVACAINE (PF) 2 MG/ML (0.2 %) IJ SOLN
0 refills | Status: DC
Start: 2018-12-04 — End: 2018-12-04
  Administered 2018-12-04: 16:00:00 8 mL via INTRAMUSCULAR

## 2018-12-04 MED ORDER — DEXAMETHASONE SODIUM PHOSPHATE 4 MG/ML IJ SOLN
INTRAVENOUS | 0 refills | Status: DC
Start: 2018-12-04 — End: 2018-12-04
  Administered 2018-12-04: 13:00:00 8 mg via INTRAVENOUS

## 2018-12-04 MED ORDER — CARBIDOPA-LEVODOPA 25-100 MG PO TAB
1.5 | Freq: Three times a day (TID) | ORAL | 0 refills | Status: DC
Start: 2018-12-04 — End: 2018-12-05
  Administered 2018-12-05 (×2): 1.5 via ORAL

## 2018-12-04 MED ORDER — FENTANYL CITRATE (PF) 50 MCG/ML IJ SOLN
25-50 ug | INTRAVENOUS | 0 refills | Status: DC | PRN
Start: 2018-12-04 — End: 2018-12-05

## 2018-12-04 MED ORDER — ROSUVASTATIN 20 MG PO TAB
10 mg | Freq: Every evening | ORAL | 0 refills | Status: DC
Start: 2018-12-04 — End: 2018-12-05
  Administered 2018-12-05: 01:00:00 10 mg via ORAL

## 2018-12-04 MED ORDER — LIDOCAINE (PF) 10 MG/ML (1 %) IJ SOLN
.1-2 mL | INTRAMUSCULAR | 0 refills | Status: DC | PRN
Start: 2018-12-04 — End: 2018-12-04

## 2018-12-04 MED ORDER — BUPIVACAINE 0.125% PCA PNC SYR
PERINEURAL | 0 refills | Status: DC
Start: 2018-12-04 — End: 2018-12-05
  Administered 2018-12-04 – 2018-12-05 (×4): 50.000 mL via PERINEURAL

## 2018-12-04 MED ORDER — SUCCINYLCHOLINE CHLORIDE 20 MG/ML IJ SOLN
INTRAVENOUS | 0 refills | Status: DC
Start: 2018-12-04 — End: 2018-12-04
  Administered 2018-12-04: 13:00:00 100 mg via INTRAVENOUS

## 2018-12-04 MED ORDER — PHENYLEPHRINE IN 0.9% NACL(PF) 1 MG/10 ML (100 MCG/ML) IV SYRG
INTRAVENOUS | 0 refills | Status: DC
Start: 2018-12-04 — End: 2018-12-04
  Administered 2018-12-04: 13:00:00 100 ug via INTRAVENOUS

## 2018-12-04 MED ORDER — FENTANYL CITRATE (PF) 50 MCG/ML IJ SOLN
0 refills | Status: DC
Start: 2018-12-04 — End: 2018-12-04
  Administered 2018-12-04: 15:00:00 25 ug via INTRAVENOUS
  Administered 2018-12-04: 13:00:00 100 ug via INTRAVENOUS
  Administered 2018-12-04: 15:00:00 25 ug via INTRAVENOUS
  Administered 2018-12-04: 12:00:00 100 ug via INTRAVENOUS

## 2018-12-04 MED ORDER — BENZTROPINE 0.5 MG PO TAB
ORAL_TABLET | Freq: Two times a day (BID) | ORAL | 0 refills | 30.00000 days | Status: DC
Start: 2018-12-04 — End: 2019-03-16

## 2018-12-04 MED ORDER — OXYCODONE 5 MG PO TAB
5-10 mg | Freq: Once | ORAL | 0 refills | Status: DC | PRN
Start: 2018-12-04 — End: 2018-12-04

## 2018-12-04 MED ORDER — LIDOCAINE (PF) 10 MG/ML (1 %) IJ SOLN
SUBCUTANEOUS | 0 refills | Status: CP
Start: 2018-12-04 — End: ?
  Administered 2018-12-04: 12:00:00 2 mL via SUBCUTANEOUS

## 2018-12-04 MED ORDER — OXYCODONE 5 MG PO TAB
5-15 mg | ORAL | 0 refills | Status: DC | PRN
Start: 2018-12-04 — End: 2018-12-05

## 2018-12-04 MED ORDER — DEXAMETHASONE SODIUM PHOSPHATE 10 MG/ML IJ SOLN
0 refills | Status: CP
Start: 2018-12-04 — End: ?
  Administered 2018-12-04: 13:00:00 4 mg

## 2018-12-04 MED ORDER — SODIUM CHLORIDE 0.9 % IV SOLP
INTRAVENOUS | 0 refills | Status: DC
Start: 2018-12-04 — End: 2018-12-05
  Administered 2018-12-04 – 2018-12-05 (×2): 1000.000 mL via INTRAVENOUS

## 2018-12-04 MED ORDER — CEFAZOLIN 1 GRAM IJ SOLR
0 refills | Status: DC
Start: 2018-12-04 — End: 2018-12-04
  Administered 2018-12-04: 13:00:00 2 g via INTRAVENOUS

## 2018-12-04 MED ORDER — ACETAMINOPHEN 325 MG PO TAB
650 mg | ORAL | 0 refills | Status: DC | PRN
Start: 2018-12-04 — End: 2018-12-05

## 2018-12-04 MED ORDER — MAGNESIUM HYDROXIDE 2,400 MG/10 ML PO SUSP
10 mL | Freq: Every day | ORAL | 0 refills | Status: DC
Start: 2018-12-04 — End: 2018-12-05
  Administered 2018-12-05: 01:00:00 10 mL via ORAL

## 2018-12-04 MED ORDER — DIPHENHYDRAMINE HCL 25 MG PO CAP
25 mg | ORAL | 0 refills | Status: DC | PRN
Start: 2018-12-04 — End: 2018-12-05

## 2018-12-04 MED ORDER — ONDANSETRON HCL (PF) 4 MG/2 ML IJ SOLN
4 mg | INTRAVENOUS | 0 refills | Status: DC | PRN
Start: 2018-12-04 — End: 2018-12-05

## 2018-12-04 MED ORDER — DIPHENHYDRAMINE HCL 50 MG/ML IJ SOLN
25 mg | INTRAVENOUS | 0 refills | Status: DC | PRN
Start: 2018-12-04 — End: 2018-12-05

## 2018-12-04 MED ORDER — ONDANSETRON HCL (PF) 4 MG/2 ML IJ SOLN
INTRAVENOUS | 0 refills | Status: DC
Start: 2018-12-04 — End: 2018-12-04

## 2018-12-04 MED ORDER — BUPIVACAINE 0.25 % (2.5 MG/ML) IJ SOLN
0 refills | Status: CP
Start: 2018-12-04 — End: ?
  Administered 2018-12-04: 13:00:00 20 mL

## 2018-12-04 MED ORDER — LACTATED RINGERS IV SOLP
1000 mL | INTRAVENOUS | 0 refills | Status: DC
Start: 2018-12-04 — End: 2018-12-04
  Administered 2018-12-04: 12:00:00 1000.000 mL via INTRAVENOUS

## 2018-12-04 MED ORDER — ROPIVACAINE (PF) 5 MG/ML (0.5 %) IJ SOLN
0 refills | Status: CP
Start: 2018-12-04 — End: ?
  Administered 2018-12-04: 12:00:00 20 mL

## 2018-12-04 MED ORDER — CEFAZOLIN INJ 1GM IVP
2 g | INTRAVENOUS | 0 refills | Status: CP
Start: 2018-12-04 — End: ?
  Administered 2018-12-04 – 2018-12-05 (×2): 2 g via INTRAVENOUS

## 2018-12-04 MED ADMIN — LACTATED RINGERS IV SOLP [4318]: 1000 mL | INTRAVENOUS | @ 12:00:00 | Stop: 2018-12-04 | NDC 00338011704

## 2018-12-05 MED ORDER — OXYCODONE 5 MG PO TAB
5-15 mg | ORAL_TABLET | ORAL | 0 refills | 6.00000 days | Status: DC | PRN
Start: 2018-12-05 — End: 2018-12-06

## 2018-12-05 MED ORDER — ROPIVACAINE 0.2% INFUSION (ON-Q PUMP CB004/P400X2-14)
PERINEURAL | 0 refills | Status: DC
Start: 2018-12-05 — End: 2018-12-05
  Administered 2018-12-05: 16:00:00 400.000 mL via PERINEURAL

## 2018-12-05 MED ORDER — ONDANSETRON HCL 4 MG PO TAB
4 mg | ORAL_TABLET | ORAL | 0 refills | 10.00000 days | Status: DC | PRN
Start: 2018-12-05 — End: 2019-05-14

## 2018-12-05 MED ORDER — DOCUSATE SODIUM 100 MG PO CAP
100 mg | ORAL_CAPSULE | Freq: Two times a day (BID) | ORAL | 0 refills | 30.00000 days | Status: DC | PRN
Start: 2018-12-05 — End: 2019-05-14

## 2018-12-06 ENCOUNTER — Encounter: Admit: 2018-12-06 | Discharge: 2018-12-06 | Payer: MEDICARE

## 2018-12-06 DIAGNOSIS — I10 Essential (primary) hypertension: Secondary | ICD-10-CM

## 2018-12-06 DIAGNOSIS — G20 Parkinson's disease: Secondary | ICD-10-CM

## 2018-12-06 MED ORDER — OXYCODONE 5 MG PO CAP
5 mg | ORAL_CAPSULE | ORAL | 0 refills | 6.00000 days | Status: DC | PRN
Start: 2018-12-06 — End: 2019-04-12

## 2018-12-06 NOTE — Telephone Encounter
Received call from Legrand Como, pharmacist w/[facility] (ph. [phone]) stating the insurance will not cover oxycodone as written (5-15mg  q 3 hours prn pain) and would like the rx to be written for 6 tabs daily. Oxycodone 5mg  q 4 hrs prn pain verbal given and insurance authorized rx. Mitchell Burnett notified of medication instruction changes and advised he may take Tylenol with Oxycodone. He verbalized understanding.

## 2018-12-06 NOTE — Telephone Encounter
Patient is calling regarding his Carbidopa/Levodopa.  He recently was in the hospital and reported that he woke up Monday and Tuesday night in which he was on top of the bed and then he was on the floor.  I explained to the patient that this is more than likely related to the pain medication he was on.      His request at this time is while he is recovering from surgery he is wanting to know if he can either decrease the dosage of the Carbidopa/levodopa or the number of times he takes it per day.  Reason for this request is he says the medication makes him very drowsy and he feels this is going to interfere with his rehab.

## 2018-12-06 NOTE — Telephone Encounter
He may decreased his CD/LD from 1.5 pills TID to 1 pill TID during his recovery

## 2018-12-07 ENCOUNTER — Encounter: Admit: 2018-12-07 | Discharge: 2018-12-07 | Payer: MEDICARE

## 2018-12-07 NOTE — Telephone Encounter
Department of Anesthesiology    Progress Note for Home Peripheral Nerve Catheter Infusions    Name: Mitchell Burnett is a 68 y.o. male.     DOB:November 27, 1950       MRN:  [id number]     Operative Procedure:   LEFT COMPLEX TRIPLE ARTHRODESIS - CASE LENGTH 2.5 HOURS  LEFT FIRST TARSOMETATARSAL ARTHRODESIS  LEFT LATERAL ANLKE LIGAMENT  LEFT ACHILLES TENDON LENGTHENING   LEFT  FIRST METATARSOPHALANGEAL JOINT RTS IMPLANT - RTS IMPLANT  CENTRALIZATION TIBIALIS ANTERIOR TENDON         Surgeon: Leta Speller, MD    Surgery Date: 12/04/18    Continous catheter:  Left and Popliteal     PNC Day#2    Catheter removed by patient without difficulty    Notes:   Patient reports that his OnQ ball was disconnected from the catheter. Discussed with the patient that it is best to remove the catheter and walked him through it on the phone. No complication with removal and he reports blue tip was intact.

## 2018-12-12 ENCOUNTER — Encounter: Admit: 2018-12-12 | Discharge: 2018-12-12 | Payer: MEDICARE

## 2018-12-12 ENCOUNTER — Ambulatory Visit: Admit: 2018-12-12 | Discharge: 2018-12-12 | Payer: MEDICARE

## 2018-12-12 DIAGNOSIS — Z09 Encounter for follow-up examination after completed treatment for conditions other than malignant neoplasm: Secondary | ICD-10-CM

## 2018-12-12 DIAGNOSIS — G20 Parkinson's disease: Secondary | ICD-10-CM

## 2018-12-12 DIAGNOSIS — I10 Essential (primary) hypertension: Secondary | ICD-10-CM

## 2018-12-13 ENCOUNTER — Encounter: Admit: 2018-12-13 | Discharge: 2018-12-13 | Payer: MEDICARE

## 2018-12-13 DIAGNOSIS — I10 Essential (primary) hypertension: Secondary | ICD-10-CM

## 2018-12-13 DIAGNOSIS — G20 Parkinson's disease: Secondary | ICD-10-CM

## 2018-12-22 ENCOUNTER — Encounter: Admit: 2018-12-22 | Discharge: 2018-12-22 | Payer: MEDICARE

## 2018-12-22 DIAGNOSIS — M2142 Flat foot [pes planus] (acquired), left foot: Secondary | ICD-10-CM

## 2018-12-22 DIAGNOSIS — Z09 Encounter for follow-up examination after completed treatment for conditions other than malignant neoplasm: Secondary | ICD-10-CM

## 2018-12-26 ENCOUNTER — Encounter: Admit: 2018-12-26 | Discharge: 2018-12-26 | Payer: MEDICARE

## 2018-12-26 ENCOUNTER — Ambulatory Visit: Admit: 2018-12-26 | Discharge: 2018-12-26 | Payer: MEDICARE

## 2018-12-26 DIAGNOSIS — M2142 Flat foot [pes planus] (acquired), left foot: Secondary | ICD-10-CM

## 2018-12-26 DIAGNOSIS — Z09 Encounter for follow-up examination after completed treatment for conditions other than malignant neoplasm: Secondary | ICD-10-CM

## 2018-12-26 DIAGNOSIS — G20 Parkinson's disease: Secondary | ICD-10-CM

## 2018-12-26 DIAGNOSIS — I10 Essential (primary) hypertension: Secondary | ICD-10-CM

## 2018-12-27 ENCOUNTER — Encounter: Admit: 2018-12-27 | Discharge: 2018-12-27 | Payer: MEDICARE

## 2018-12-27 DIAGNOSIS — I10 Essential (primary) hypertension: Secondary | ICD-10-CM

## 2018-12-27 DIAGNOSIS — G20 Parkinson's disease: Secondary | ICD-10-CM

## 2019-01-10 MED ORDER — NABUMETONE 750 MG PO TAB
ORAL_TABLET | Freq: Every day | ORAL | 1 refills | 30.00000 days | Status: DC
Start: 2019-01-10 — End: 2019-05-14

## 2019-01-15 MED ORDER — OXYCODONE 5 MG PO TAB
ORAL_TABLET | 0 refills | PRN
Start: 2019-01-15 — End: ?

## 2019-01-19 ENCOUNTER — Encounter: Admit: 2019-01-19 | Discharge: 2019-01-19 | Payer: MEDICARE

## 2019-01-19 DIAGNOSIS — M2142 Flat foot [pes planus] (acquired), left foot: Secondary | ICD-10-CM

## 2019-01-19 DIAGNOSIS — Z09 Encounter for follow-up examination after completed treatment for conditions other than malignant neoplasm: Secondary | ICD-10-CM

## 2019-01-23 ENCOUNTER — Ambulatory Visit: Admit: 2019-01-23 | Discharge: 2019-01-23 | Payer: MEDICARE

## 2019-01-23 ENCOUNTER — Encounter: Admit: 2019-01-23 | Discharge: 2019-01-23 | Payer: MEDICARE

## 2019-01-23 DIAGNOSIS — Z09 Encounter for follow-up examination after completed treatment for conditions other than malignant neoplasm: Secondary | ICD-10-CM

## 2019-01-23 DIAGNOSIS — G20 Parkinson's disease: Secondary | ICD-10-CM

## 2019-01-23 DIAGNOSIS — I10 Essential (primary) hypertension: Secondary | ICD-10-CM

## 2019-01-23 DIAGNOSIS — M2142 Flat foot [pes planus] (acquired), left foot: Secondary | ICD-10-CM

## 2019-01-24 ENCOUNTER — Encounter: Admit: 2019-01-24 | Discharge: 2019-01-24 | Payer: MEDICARE

## 2019-01-24 DIAGNOSIS — G20 Parkinson's disease: Secondary | ICD-10-CM

## 2019-01-24 DIAGNOSIS — I10 Essential (primary) hypertension: Secondary | ICD-10-CM

## 2019-02-19 ENCOUNTER — Encounter: Admit: 2019-02-19 | Discharge: 2019-02-19 | Payer: MEDICARE

## 2019-02-19 ENCOUNTER — Ambulatory Visit: Admit: 2019-02-19 | Discharge: 2019-02-19 | Payer: MEDICARE

## 2019-02-19 DIAGNOSIS — M5416 Radiculopathy, lumbar region: Secondary | ICD-10-CM

## 2019-02-19 DIAGNOSIS — M48062 Spinal stenosis, lumbar region with neurogenic claudication: Secondary | ICD-10-CM

## 2019-02-19 DIAGNOSIS — M5136 Other intervertebral disc degeneration, lumbar region: Secondary | ICD-10-CM

## 2019-02-19 DIAGNOSIS — I10 Essential (primary) hypertension: Secondary | ICD-10-CM

## 2019-02-19 DIAGNOSIS — G20 Parkinson's disease: Secondary | ICD-10-CM

## 2019-02-19 MED ORDER — LIDOCAINE (PF) 10 MG/ML (1 %) IJ SOLN
2 mL | Freq: Once | INTRAMUSCULAR | 0 refills | Status: CP
Start: 2019-02-19 — End: ?

## 2019-02-19 MED ORDER — METHYLPREDNISOLONE ACETATE 40 MG/ML IJ SUSP
40 mg | Freq: Once | INTRAMUSCULAR | 0 refills | Status: CP
Start: 2019-02-19 — End: ?

## 2019-02-19 MED ORDER — IOHEXOL 300 MG IODINE/ML IV SOLN
2 mL | Freq: Once | 0 refills | Status: CP
Start: 2019-02-19 — End: ?

## 2019-02-19 NOTE — Discharge Instructions - Supplementary Instructions
..  GENERAL POST PROCEDURE INSTRUCTIONS    Time:____________________  Physician:_________________________________    Procedure Completed Today:  o Joint Injection (hip, knee, shoulder)  o Cervical Epidural Steroid Injection  o Cervical Transforaminal Steroid Injection  o Trigger Point Injection  o Other___________________________ o Thoracic Epidural Steroid Injection  o Lumbar Epidural Steroid Injection  o Lumbar Transforaminal Steroid Injection  o Facet Joint Injection     Important information following your procedure today:  ? You may drive today     ? If you had sedation ,you may NOT drive today  ? Rest at home for the next 6 hours.  You may then begin to resume your normal activities.  ? DO NOT drive any vehicle, operate any power tools, drink alcohol, make any major decisions, or sign any legal documents for the next 12 hours.  1. Pain relief may not be immediate. It is possible you may even experience an increase in pain during the first 24-48 hours followed by a gradual decrease of your pain.  2. Though the procedure is generally safe and complications are rare, we do ask that you be aware of any of the following:  ? Any swelling, persistent redness, new bleeding or drainage from the site of the injection.  ? You should not experience a severe headache.  ? You should not run a fever over 101oF.  ? New onset of sharp, severe back and or neck pain.  ? New onset of upper or lower extremity numbness or weakness.  ? New difficulty controlling bowel or bladder function after injection.  ? New shortness of breath.  If any of these occur, please call to report this occurrence to a nurse at [phone]. If you are calling after 4:00pm, on the weekends or holidays please call [phone] and ask to have the pain management resident physician on call for the physician paged or go to your local emergency room. 3. You may experience soreness at the injection site. Ice can be applied at 20 minute intervals for the first 24 hours. The following day you may alternate ice with heat if you are experiencing muscle tightness, otherwise continue with ice. Ice works best at decreasing pain. Avoid application of direct heat, hot showers or hot tubs today.  4. Avoid strenuous activity today. You many resume your regular activities and exercise tomorrow.  5. Patients with diabetes may see an elevation in blood sugars for 7-10 days after the injection. It is important to pay close attention to you diet, check your blood sugars daily and repost extreme elevations to the physician that treats your diabetes.  6. Patients taking daily blood thinners can resume their regular dose this evening.  7. It is important that you take all medications ordered by your pain physician. Taking medications as ordered is an important part of you pain care plan. If you cannot continue the medication plan, please notify the physician.  Possible side effects to steroids that may occur:  ? Flushing or redness of the face  ? Irritability  ? Fluid retention  ? Change in women's menses  Follow up appointment as needed if in the event you are unable to keep an appointment please notify the scheduler 24 hours in advance at [phone].    ____________________________________  ____________________________________

## 2019-02-19 NOTE — Progress Notes
SPINE CENTER  INTERVENTIONAL PAIN PROCEDURE HISTORY AND PHYSICAL    No chief complaint on file.      HISTORY OF PRESENT ILLNESS:  Recurrent pain  Good relief with last inj    Medical History:   Diagnosis Date   ? Hypertension    ? Parkinson disease [facility])        Surgical History:   Procedure Laterality Date   ? SHOULDER SURGERY  1983    Right- dislocated   ? CERVICAL SPINE SURGERY  2009   ? LEFT COMPLEX TRIPLE ARTHRODESIS Left 12/04/2018    Performed by Ree Shay, MD at [facility] [place]   ? LEFT FIRST TARSOMETATARSAL ARTHRODESIS Left 12/04/2018    Performed by Ree Shay, MD at [facility]   ? LEFT LATERAL ANLKE LIGAMENT Left 12/04/2018    Performed by Ree Shay, MD at [facility]   ? LEFT ACHILLES TENDON LENGTHENING  Left 12/04/2018    Performed by Ree Shay, MD at [facility] [place]   ? LEFT  FIRST METATARSOPHALANGEAL JOINT RTS IMPLANT Left 12/04/2018    Performed by Ree Shay, MD at [facility] [place]   ? CENTRALIZATION TIBIALIS ANTERIOR TENDON Left 12/04/2018    Performed by Ree Shay, MD at [facility]   ? COLONOSCOPY         family history includes Cancer in his brother and mother.    Social History     Socioeconomic History   ? Marital status: Married     Spouse name: Not on file   ? Number of children: Not on file   ? Years of education: Not on file   ? Highest education level: Not on file   Occupational History   ? Not on file   Tobacco Use   ? Smoking status: Never Smoker   ? Smokeless tobacco: Never Used   Substance and Sexual Activity   ? Alcohol use: Yes     Alcohol/week: 0.0 standard drinks     Comment: 2-3 drinks per week   ? Drug use: No   ? Sexual activity: Not on file   Other Topics Concern   ? Not on file   Social History Narrative   ? Not on file       Allergies   Allergen Reactions   ? Pcn [Penicillins] UNKNOWN       Vitals:    02/19/19 0951   Temp: 36.6 ?C (97.9 ?F)       REVIEW OF SYSTEMS: 10 point ROS obtained and negative except pain      PHYSICAL EXAM:General Appearance: moderately overweight, no distress Respiratory Effort: breathing comfortably, no respiratory distress   Pedal Pulses: normal symmetric pedal pulses   Lower Extremity Edema: no lower extremity edema   Abdominal Exam: mildly protuberant but soft, non-tender, no masses, bowel sounds normal   Gait & Station: walks without assistance   Muscle Strength: normal muscle tone   Orientation: oriented to time, place and person   Affect & Mood: appropriate and sustained affect   Language and Memory: patient responsive and seems to comprehend information   Neurologic Exam: neurological assessment grossly intact   Other: moves all extremities          IMPRESSION:    1. Spinal stenosis of lumbar region with neurogenic claudication    2. Lumbar radiculopathy         PLAN: Lumbar Transforaminal Steroid Injection

## 2019-02-19 NOTE — Procedures
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 Lumbar radiculopathy        Post-Procedure Diagnosis:   1. Spinal stenosis of lumbar region with neurogenic claudication    2. Lumbar radiculopathy        Selective Nerve Rootblock/Transforaminal Lumbar/Sacral  Procedure: transforaminal epidural    Laterality: bilateral    Location: lumbar - L4-5      Consent:   Consent obtained: verbal and written  Consent given by: patient  Risks discussed: allergic reaction, bleeding, bruising and infection  Alternatives discussed: alternative treatment, delayed treatment and no treatment  Discussed with patient the purpose of the treatment/procedure, other ways of treating my condition, including no treatment/ procedure and the risks and benefits of the alternatives. Patient has decided to proceed with treatment/procedure.        Universal Protocol:  Relevant documents: relevant documents present and verified  Test results: test results available and properly labeled  Imaging studies: imaging studies available  Required items: required blood products, implants, devices, and special equipment available  Site marked: the operative site was marked  Patient identity confirmed: Patient identify confirmed verbally with patient.        Time out: Immediately prior to procedure a time out was called to verify the correct patient, procedure, equipment, support staff and site/side marked as required      Procedures Details:   Indications: pain and diagnostic evaluation   Prep: Betadine  Patient position: prone  Estimated Blood Loss: minimal  Specimens: none  Number of Levels: 2  Approach: right paramedian  Guidance: fluoroscopy  Contrast: Procedure confirmed with contrast under live fluoroscopy.  Needle and Epidural Catheter: pencil-tip  Needle size: 25 G  Injection procedure: Introduced needle Patient tolerance: Patient tolerated the procedure well with no immediate complications. Pressure was applied, and hemostasis was accomplished.  Outcome: Pain improved  Comments: INTERVENTIONAL PAIN MANAGEMENT PROCEDURE REPORT    Lumbar Transforaminal Epidural Injection    Date of Service: @ED @    Procedure Title(s):    1. Bilateral L4-L5 transforaminal epidural injection   2. Intraoperative fluoroscopy     Attending Surgeon: @ENCPROVNMTITLE @    Anesthesia: Local           Anxiolysis No           Procedural Sedation No    Indications: Mitchell Burnett is a 69 y.o. male with a diagnosis of lumbar radicular pain, disc bulge. The patient's history and physical exam were reviewed. The risks, benefits and alternatives to the procedure were discussed, and all questions were answered to the patient's satisfaction. The patient agreed to proceed, and written informed consent was obtained.     Procedure in Detail: IV was started? No    The patient was brought into the procedure room and placed in the prone position on the fluoroscopy table. Standard monitors were placed, and vital signs were observed throughout the procedure. The area of the lumbar spine was prepped with betadine and draped in a sterile manner. The L4-L5 vertebral bodies were identified with AP fluoroscopy. An oblique view to the right then right  was obtained to better visualize the inferior junction of the pedicle and transverse process. The 6 o?clock position of the pedicle was marked and identified. The skin and subcutaneous tissues in the area were anesthetized with 1% lidocaine. A 25-gauge, 3.5 inch needle was directed toward  the targeted point under fluoroscopy until bone was contacted. The needle was then walked inferiorly until the neural foramen was entered. A lateral fluoroscopic view was then used to place the needle tip at the 10 o?clock position of the foramen. Negative aspiration was confirmed, and 1ml of contrast was injected at each level. Appropriate neurograms were observed under live AP fluoroscopy with no noted vascular or intrathecal uptake. Then, after negative aspiration, a solution consisting of 1mL 0.25% bupivacaine and  40 (steroid) mg was easily injected at each level. The needles were removed with a 1% lidocaine flush. The patient?s back was cleaned and a bandage was placed over the needle insertion points.    The same procedure was performed on the opposite side? Yes    Disposition: The patient tolerated the procedure well, and there were no apparent complications. Vital signs remained stable througtout the procedure. The patient was taken to the recovery area where discharge instructions for the procedure were given.     Estimated Blood Loss: minimal    Specimens: none    Complications: none        Estimated blood loss: none or minimal  Specimens: none  Patient tolerated the procedure well with no immediate complications. Pressure was applied, and hemostasis was accomplished.

## 2019-03-13 ENCOUNTER — Ambulatory Visit: Admit: 2019-03-13 | Discharge: 2019-03-14 | Payer: MEDICARE

## 2019-03-13 ENCOUNTER — Encounter: Admit: 2019-03-13 | Discharge: 2019-03-13 | Payer: MEDICARE

## 2019-03-13 DIAGNOSIS — M5136 Other intervertebral disc degeneration, lumbar region: Secondary | ICD-10-CM

## 2019-03-13 DIAGNOSIS — G20 Parkinson's disease: Secondary | ICD-10-CM

## 2019-03-13 DIAGNOSIS — I10 Essential (primary) hypertension: Secondary | ICD-10-CM

## 2019-03-13 NOTE — Progress Notes
SPINE CENTER CLINIC NOTE       SUBJECTIVE: cont back pain  Min leg pain  Tried injections  Used to work now not as helpful  Ho parkinsons  Had left ankle surgery with improvement  Here for discussion of other oiptions         Review of Systems   Gastrointestinal: Positive for constipation.   Musculoskeletal: Positive for arthralgias and back pain.   All other systems reviewed and are negative.      Current Outpatient Medications:   ?  acetaminophen (TYLENOL) 325 mg tablet, Take 325 mg by mouth every 4 hours as needed for Pain., Disp: , Rfl:   ?  Apple Cider Vinegar 300 mg tab, Take 1 tablet by mouth daily., Disp: , Rfl:   ?  benztropine (COGENTIN) 0.5 mg tablet, TAKE 1 TABLET BY MOUTH TWICE DAILY FOR  TREMOR, Disp: 180 tablet, Rfl: 0  ?  carbidopa/levodopa (SINEMET) 25/100 mg tablet, One and 1/2 pills three times a day, just before meals, Disp: 415 tablet, Rfl: 3  ?  cholecalciferol (VITAMIN D-3) 1,000 units tablet, Take 1,000 Units by mouth daily., Disp: , Rfl:   ?  docusate (COLACE) 100 mg capsule, Take one capsule by mouth twice daily as needed for Constipation., Disp: 50 capsule, Rfl: 0  ?  doxazosin (CARDURA) 8 mg tablet, Take 8 mg by mouth at bedtime daily., Disp: , Rfl:   ?  hydroCHLOROthiazide (HYDRODIURIL) 25 mg tablet, Take 25 mg by mouth every morning., Disp: , Rfl:   ?  nabumetone (RELAFEN) 750 mg tablet, TAKE 2 TABLETS BY MOUTH ONCE DAILY **REPLACES  MELOXICAM, Disp: 180 tablet, Rfl: 1  ?  ondansetron (ZOFRAN) 4 mg tablet, Take one tablet by mouth every 8 hours as needed for Nausea or Vomiting., Disp: 15 tablet, Rfl: 0  ?  oxycodone (OXYIR) 5 mg capsule, Take one capsule by mouth every 4 hours as needed, Disp: 50 capsule, Rfl: 0  ?  rosuvastatin (CRESTOR) 10 mg tablet, Take 10 mg by mouth at bedtime daily., Disp: , Rfl:   ?  sildenafil(+) (VIAGRA) 50 mg tablet, Take 1 Tab by mouth as Needed for Erectile dysfunction., Disp: 10 Tab, Rfl: 5  Allergies   Allergen Reactions   ? Pcn [Penicillins] UNKNOWN Physical Exam  Vitals:    03/13/19 1004   BP: 133/77   BP Source: Arm, Left Upper   Patient Position: Sitting   Pulse: 64   Temp: 36.7 ?C (98.1 ?F)   TempSrc: Temporal   Weight: 76.3 kg (168 lb 4.8 oz)   Height: 177.8 cm (70)   PainSc: Six        Pain Score: Six  Body mass index is 24.15 kg/m?Marland Kitchen    General Appearance: moderately overweight, no distress     Respiratory Effort: breathing comfortably, no respiratory distress   Gait & Station: walks without assistance   Muscle Strength: normal muscle tone   Orientation: oriented to time, place and person   Affect & Mood: appropriate and sustained affect   Language and Memory: patient responsive and seems to comprehend information   Neurologic Exam: neurological assessment grossly intact   Other: moves all extremities         IMPRESSION:    1. Spinal stenosis of lumbar region with neurogenic claudication  [place] AMB SPINE INJECT PVRT FACET MBB JT LMBR/SAC   2. Lumbar spondylosis  [place] AMB SPINE INJECT PVRT FACET MBB JT LMBR/SAC   3. Lumbar degenerative disc disease  [place]  AMB SPINE INJECT PVRT FACET MBB JT LMBR/SAC         PLAN:      Rev imaging  Discussed possible pain sources  Sig arthritis   Possible option sinclude new imaging, eval with spine surgeon vs MBB and RFA  Opts to proceed with MBB then RFA  Risks disucssed  Questions answered

## 2019-03-14 DIAGNOSIS — M47816 Spondylosis without myelopathy or radiculopathy, lumbar region: Secondary | ICD-10-CM

## 2019-03-14 DIAGNOSIS — M48062 Spinal stenosis, lumbar region with neurogenic claudication: Secondary | ICD-10-CM

## 2019-03-16 ENCOUNTER — Encounter: Admit: 2019-03-16 | Discharge: 2019-03-16 | Payer: MEDICARE

## 2019-03-16 MED ORDER — BENZTROPINE 0.5 MG PO TAB
ORAL_TABLET | Freq: Two times a day (BID) | ORAL | 0 refills | 30.00000 days | Status: DC
Start: 2019-03-16 — End: 2019-07-10

## 2019-04-10 ENCOUNTER — Encounter: Admit: 2019-04-10 | Discharge: 2019-04-10 | Payer: MEDICARE

## 2019-04-10 DIAGNOSIS — M2142 Flat foot [pes planus] (acquired), left foot: Secondary | ICD-10-CM

## 2019-04-12 ENCOUNTER — Ambulatory Visit: Admit: 2019-04-12 | Discharge: 2019-04-12 | Payer: MEDICARE

## 2019-04-12 ENCOUNTER — Encounter: Admit: 2019-04-12 | Discharge: 2019-04-12 | Payer: MEDICARE

## 2019-04-12 DIAGNOSIS — I10 Essential (primary) hypertension: Secondary | ICD-10-CM

## 2019-04-12 DIAGNOSIS — Z09 Encounter for follow-up examination after completed treatment for conditions other than malignant neoplasm: Secondary | ICD-10-CM

## 2019-04-12 DIAGNOSIS — M21962 Unspecified acquired deformity of left lower leg: Secondary | ICD-10-CM

## 2019-04-12 DIAGNOSIS — G20 Parkinson's disease: Secondary | ICD-10-CM

## 2019-04-12 DIAGNOSIS — M2142 Flat foot [pes planus] (acquired), left foot: Secondary | ICD-10-CM

## 2019-04-13 ENCOUNTER — Encounter: Admit: 2019-04-13 | Discharge: 2019-04-13 | Payer: MEDICARE

## 2019-04-13 NOTE — Telephone Encounter
Attempted to reach Mr. Hellard via telephone in regards to scheduling surgery (Left hallux IP fusion, FHL tenotomy), he did not answer. Lvm to return call to this nurse directly at ext [phone].

## 2019-04-16 ENCOUNTER — Encounter: Admit: 2019-04-16 | Discharge: 2019-04-16 | Payer: MEDICARE

## 2019-04-16 DIAGNOSIS — I10 Essential (primary) hypertension: Secondary | ICD-10-CM

## 2019-04-16 DIAGNOSIS — G20 Parkinson's disease: Secondary | ICD-10-CM

## 2019-05-02 ENCOUNTER — Ambulatory Visit: Admit: 2019-05-02 | Discharge: 2019-05-02 | Payer: MEDICARE

## 2019-05-02 ENCOUNTER — Encounter: Admit: 2019-05-02 | Discharge: 2019-05-02 | Payer: MEDICARE

## 2019-05-02 DIAGNOSIS — M79671 Pain in right foot: Secondary | ICD-10-CM

## 2019-05-02 DIAGNOSIS — M960 Pseudarthrosis after fusion or arthrodesis: Secondary | ICD-10-CM

## 2019-05-02 DIAGNOSIS — Z20822 Encounter for screening laboratory testing for COVID-19 virus in asymptomatic patient: Secondary | ICD-10-CM

## 2019-05-03 ENCOUNTER — Encounter: Admit: 2019-05-03 | Discharge: 2019-05-03 | Payer: MEDICARE

## 2019-05-03 NOTE — Progress Notes
PA sent for Benztropine Mesylate 0.5MG  tablets via Cover My Meds waiting for response.    [facility] [employer]  PA Case ID: [id number]

## 2019-05-03 NOTE — Progress Notes
PA approved for Benztropine Mesylate 0.5MG  tablets.  Prior Auth;Coverage Start Date:04/03/2019;Coverage End Date:05/02/2020  Key: [id number]  PA Case ID: [id number]

## 2019-05-13 ENCOUNTER — Encounter: Admit: 2019-05-13 | Discharge: 2019-05-13 | Payer: MEDICARE

## 2019-05-13 MED ORDER — HALOPERIDOL LACTATE 5 MG/ML IJ SOLN
1 mg | Freq: Once | INTRAVENOUS | 0 refills | Status: CN | PRN
Start: 2019-05-13 — End: ?

## 2019-05-13 MED ORDER — PROMETHAZINE 25 MG/ML IJ SOLN
6.25 mg | INTRAVENOUS | 0 refills | Status: CN | PRN
Start: 2019-05-13 — End: ?

## 2019-05-14 ENCOUNTER — Encounter: Admit: 2019-05-14 | Discharge: 2019-05-14 | Payer: MEDICARE

## 2019-05-14 ENCOUNTER — Ambulatory Visit: Admit: 2019-05-14 | Discharge: 2019-05-14 | Payer: MEDICARE

## 2019-05-14 DIAGNOSIS — I10 Essential (primary) hypertension: Secondary | ICD-10-CM

## 2019-05-14 DIAGNOSIS — G20 Parkinson's disease: Secondary | ICD-10-CM

## 2019-05-14 MED ORDER — OXYCODONE 5 MG PO TAB
5-10 mg | ORAL_TABLET | ORAL | 0 refills | 6.00000 days | Status: AC | PRN
Start: 2019-05-14 — End: ?

## 2019-05-14 MED ORDER — LIDOCAINE HCL 10 MG/ML (1 %) IJ SOLN
0 refills | Status: DC
Start: 2019-05-14 — End: 2019-05-14
  Administered 2019-05-14: 13:00:00 10 mL via INTRAMUSCULAR

## 2019-05-14 MED ORDER — PROPOFOL INJ 10 MG/ML IV VIAL
0 refills | Status: DC
Start: 2019-05-14 — End: 2019-05-14
  Administered 2019-05-14: 13:00:00 150 mg via INTRAVENOUS
  Administered 2019-05-14: 13:00:00 30 mg via INTRAVENOUS

## 2019-05-14 MED ORDER — DIPHENHYDRAMINE HCL 50 MG/ML IJ SOLN
25 mg | Freq: Once | INTRAVENOUS | 0 refills | Status: DC | PRN
Start: 2019-05-14 — End: 2019-05-14

## 2019-05-14 MED ORDER — BUPIVACAINE HCL 0.5 % (5 MG/ML) IJ SOLN
0 refills | Status: DC
Start: 2019-05-14 — End: 2019-05-14
  Administered 2019-05-14: 13:00:00 10 mL via INTRAMUSCULAR

## 2019-05-14 MED ORDER — SODIUM CHLORIDE 0.9 % IV SOLP
1000 mL | INTRAVENOUS | 0 refills | Status: DC
Start: 2019-05-14 — End: 2019-05-14

## 2019-05-14 MED ORDER — OXYCODONE 5 MG PO TAB
5-10 mg | Freq: Once | ORAL | 0 refills | Status: DC | PRN
Start: 2019-05-14 — End: 2019-05-14

## 2019-05-14 MED ORDER — CEFAZOLIN 1 GRAM IJ SOLR
0 refills | Status: DC
Start: 2019-05-14 — End: 2019-05-14
  Administered 2019-05-14: 13:00:00 2 g via INTRAVENOUS

## 2019-05-14 MED ORDER — ARTIFICIAL TEARS SINGLE DOSE DROPS GROUP
0 refills | Status: DC
Start: 2019-05-14 — End: 2019-05-14
  Administered 2019-05-14: 13:00:00 2 [drp] via OPHTHALMIC

## 2019-05-14 MED ORDER — ONDANSETRON HCL (PF) 4 MG/2 ML IJ SOLN
INTRAVENOUS | 0 refills | Status: DC
Start: 2019-05-14 — End: 2019-05-14
  Administered 2019-05-14: 14:00:00 4 mg via INTRAVENOUS

## 2019-05-14 MED ORDER — PHENYLEPHRINE HCL IN 0.9% NACL 1 MG/10 ML (100 MCG/ML) IV SYRG
INTRAVENOUS | 0 refills | Status: DC
Start: 2019-05-14 — End: 2019-05-14
  Administered 2019-05-14: 14:00:00 200 ug via INTRAVENOUS
  Administered 2019-05-14: 13:00:00 100 ug via INTRAVENOUS

## 2019-05-14 MED ORDER — EPHEDRINE SULFATE 50 MG/5ML SYR (10 MG/ML) (AN)(OSM)
0 refills | Status: DC
Start: 2019-05-14 — End: 2019-05-14
  Administered 2019-05-14 (×2): 20 mg via INTRAVENOUS

## 2019-05-14 MED ORDER — MEPERIDINE (PF) 25 MG/ML IJ SYRG
12.5 mg | INTRAVENOUS | 0 refills | Status: DC | PRN
Start: 2019-05-14 — End: 2019-05-14

## 2019-05-14 MED ORDER — ONDANSETRON HCL 4 MG PO TAB
4 mg | ORAL_TABLET | ORAL | 0 refills | 8.00000 days | Status: AC | PRN
Start: 2019-05-14 — End: ?

## 2019-05-14 MED ORDER — LACTATED RINGERS IV SOLP
INTRAVENOUS | 0 refills | Status: DC
Start: 2019-05-14 — End: 2019-05-14

## 2019-05-14 MED ORDER — ACETAMINOPHEN 500 MG PO TAB
1000 mg | Freq: Once | ORAL | 0 refills | Status: CP
Start: 2019-05-14 — End: ?
  Administered 2019-05-14: 12:00:00 1000 mg via ORAL

## 2019-05-14 MED ORDER — LIDOCAINE (PF) 10 MG/ML (1 %) IJ SOLN
.1-2 mL | INTRAMUSCULAR | 0 refills | Status: DC | PRN
Start: 2019-05-14 — End: 2019-05-14

## 2019-05-14 MED ORDER — LIDOCAINE (PF) 200 MG/10 ML (2 %) IJ SYRG
0 refills | Status: DC
Start: 2019-05-14 — End: 2019-05-14
  Administered 2019-05-14: 13:00:00 80 mg via INTRAVENOUS

## 2019-05-14 MED ORDER — DOCUSATE SODIUM 100 MG PO CAP
100 mg | ORAL_CAPSULE | Freq: Two times a day (BID) | ORAL | 0 refills | 30.00000 days | Status: AC | PRN
Start: 2019-05-14 — End: ?

## 2019-05-14 MED ORDER — HYDROMORPHONE (PF) 2 MG/ML IJ SYRG
.5 mg | INTRAVENOUS | 0 refills | Status: DC | PRN
Start: 2019-05-14 — End: 2019-05-14

## 2019-05-14 MED ORDER — FENTANYL CITRATE (PF) 50 MCG/ML IJ SOLN
0 refills | Status: DC
Start: 2019-05-14 — End: 2019-05-14
  Administered 2019-05-14 (×2): 50 ug via INTRAVENOUS

## 2019-05-14 MED ORDER — FENTANYL CITRATE (PF) 50 MCG/ML IJ SOLN
50 ug | INTRAVENOUS | 0 refills | Status: DC | PRN
Start: 2019-05-14 — End: 2019-05-14

## 2019-05-14 MED ORDER — DEXAMETHASONE SODIUM PHOSPHATE 4 MG/ML IJ SOLN
INTRAVENOUS | 0 refills | Status: DC
Start: 2019-05-14 — End: 2019-05-14
  Administered 2019-05-14: 13:00:00 4 mg via INTRAVENOUS

## 2019-05-14 MED ADMIN — SODIUM CHLORIDE 0.9 % IV SOLP [27838]: 1000 mL | INTRAVENOUS | @ 12:00:00 | Stop: 2019-05-14 | NDC 00338004904

## 2019-05-16 ENCOUNTER — Encounter: Admit: 2019-05-16 | Discharge: 2019-05-16 | Payer: MEDICARE

## 2019-05-16 DIAGNOSIS — I10 Essential (primary) hypertension: Secondary | ICD-10-CM

## 2019-05-16 DIAGNOSIS — G20 Parkinson's disease: Secondary | ICD-10-CM

## 2019-05-22 ENCOUNTER — Ambulatory Visit: Admit: 2019-05-22 | Discharge: 2019-05-22 | Payer: MEDICARE

## 2019-05-22 ENCOUNTER — Encounter: Admit: 2019-05-22 | Discharge: 2019-05-22 | Payer: MEDICARE

## 2019-05-23 ENCOUNTER — Encounter: Admit: 2019-05-23 | Discharge: 2019-05-23 | Payer: MEDICARE

## 2019-05-23 DIAGNOSIS — I10 Essential (primary) hypertension: Secondary | ICD-10-CM

## 2019-05-23 DIAGNOSIS — G20 Parkinson's disease: Secondary | ICD-10-CM

## 2019-06-18 ENCOUNTER — Encounter: Admit: 2019-06-18 | Discharge: 2019-06-18 | Payer: MEDICARE

## 2019-06-18 MED ORDER — NABUMETONE 750 MG PO TAB
1500 mg | ORAL_TABLET | Freq: Every day | ORAL | 5 refills | 30.00000 days | Status: AC
Start: 2019-06-18 — End: ?

## 2019-06-18 NOTE — Telephone Encounter
Received call from pt  Stating he is needing refills of nabumetone, appears medication was D/C'd prior to surgery on 05/14/2019.   Pt states he has been taking since surgery.   Routed to Dr. Hetty Ely for approval for new script.

## 2019-07-04 IMAGING — CR UP_EXM
1 series · 1 of 1 positions shown · non-contrast
Comparison: none

[finger]
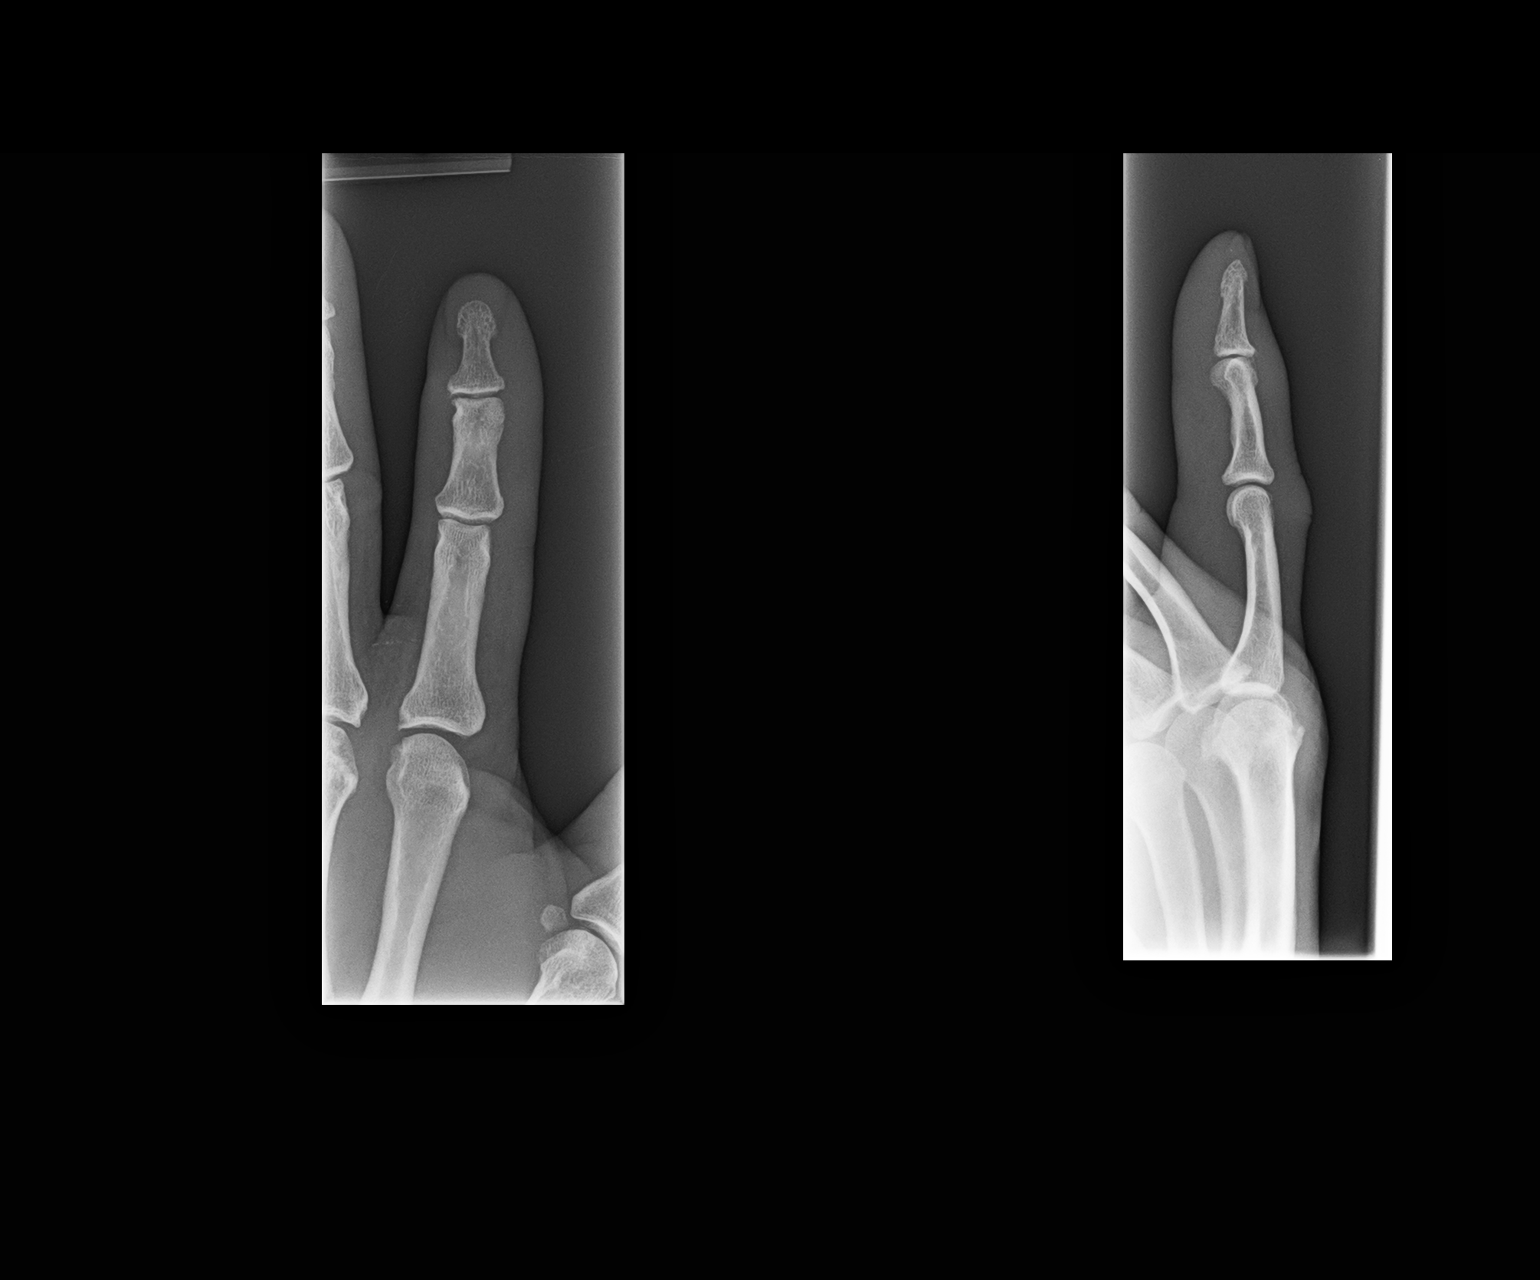

[1 of 1 positions shown; findings below may reference images not displayed]

EXAM
RADIOLOGICAL EXAMINATION, CHEST; 2 VIEWS FRONTAL AND LATERAL CPT 22868

INDICATION
left finger swelling
LEFT 2ND DIGIT SWELLING STARTED X 4 DAYS AGO. NO KNOWN INJURY. KB/CK

TECHNIQUE
Two views of the 2nd digit

COMPARISONS
There are no available comparison exams.

FINDINGS
[There is soft tissue swelling without fracture.

IMPRESSION
Soft tissue swelling without fracture

Tech Notes:

LEFT 2ND DIGIT SWELLING STARTED X 4 DAYS AGO. NO KNOWN INJURY. KB/CK

## 2019-07-06 ENCOUNTER — Encounter: Admit: 2019-07-06 | Discharge: 2019-07-06 | Payer: MEDICARE

## 2019-07-10 MED ORDER — BENZTROPINE 0.5 MG PO TAB
ORAL_TABLET | Freq: Two times a day (BID) | ORAL | 0 refills | 30.00000 days | Status: AC
Start: 2019-07-10 — End: ?

## 2019-07-10 MED ORDER — CARBIDOPA-LEVODOPA 25-100 MG PO TAB
ORAL_TABLET | Freq: Three times a day (TID) | 1 refills | Status: AC
Start: 2019-07-10 — End: ?

## 2019-07-24 ENCOUNTER — Encounter: Admit: 2019-07-24 | Discharge: 2019-07-24 | Payer: MEDICARE

## 2019-07-24 ENCOUNTER — Ambulatory Visit: Admit: 2019-07-24 | Discharge: 2019-07-25 | Payer: MEDICARE

## 2019-07-24 DIAGNOSIS — G20 Parkinson's disease: Secondary | ICD-10-CM

## 2019-07-24 DIAGNOSIS — M47816 Spondylosis without myelopathy or radiculopathy, lumbar region: Secondary | ICD-10-CM

## 2019-07-24 DIAGNOSIS — M5136 Other intervertebral disc degeneration, lumbar region: Secondary | ICD-10-CM

## 2019-07-24 DIAGNOSIS — M5416 Radiculopathy, lumbar region: Secondary | ICD-10-CM

## 2019-07-24 DIAGNOSIS — I10 Essential (primary) hypertension: Secondary | ICD-10-CM

## 2019-07-24 DIAGNOSIS — M48062 Spinal stenosis, lumbar region with neurogenic claudication: Secondary | ICD-10-CM

## 2019-07-24 DIAGNOSIS — M48061 Spinal stenosis, lumbar region without neurogenic claudication: Secondary | ICD-10-CM

## 2019-07-24 NOTE — Progress Notes
Date of Service: 07/24/2019    Subjective:             Mitchell Burnett is a 69 y.o. male.    History of Present Illness  Cont back pain  Limits some activites  Tries to push through  [facility] Park[facility]  No wekness or BB dysfunction  Epidural inj at L45 minimally helpful  L12 in past helped more  No radi at this time     Review of Systems   Eyes: Positive for visual disturbance.   Cardiovascular: Positive for leg swelling (knees).   Musculoskeletal: Positive for joint swelling.        Parkinson   Neurological: Positive for light-headedness.   All other systems reviewed and are negative.        Objective:         ? Apple Cider Vinegar 300 mg tab Take 1 tablet by mouth daily.   ? benztropine (COGENTIN) 0.5 mg tablet TAKE 1 TABLET BY MOUTH TWICE DAILY FOR  TREMOR   ? carbidopa/levodopa (SINEMET) 25/100 mg tablet 1 1/2 pills three times a day   ? cholecalciferol (VITAMIN D-3) 1,000 units tablet Take 1,000 Units by mouth daily.   ? docusate (COLACE) 100 mg capsule Take one capsule by mouth twice daily as needed for Constipation.   ? doxazosin (CARDURA) 8 mg tablet Take 8 mg by mouth at bedtime daily.   ? hydroCHLOROthiazide (HYDRODIURIL) 25 mg tablet Take 25 mg by mouth every morning.   ? nabumetone (RELAFEN) 750 mg tablet Take two tablets by mouth daily.   ? ondansetron (ZOFRAN) 4 mg tablet Take one tablet by mouth every 8 hours as needed for Nausea or Vomiting.   ? oxyCODONE (ROXICODONE) 5 mg tablet Take one tablet to two tablets by mouth every 4 hours as needed for Pain   ? rosuvastatin (CRESTOR) 10 mg tablet Take 10 mg by mouth at bedtime daily.   ? sildenafil(+) (VIAGRA) 50 mg tablet Take 1 Tab by mouth as Needed for Erectile dysfunction.     Vitals:    07/24/19 0904   BP: 121/69   BP Source: Arm, Left Upper   Patient Position: Sitting   Pulse: 64   Temp: 37.1 ?C (98.8 ?F)   TempSrc: Skin   Weight: 73.9 kg (163 lb)   Height: 179.1 cm (70.5)   PainSc: Five     Body mass index is 23.06 kg/m?Marland Kitchen     Physical Exam    General Appearance: , no distress     Respiratory Effort: breathing comfortably, no respiratory distress   Gait & Station: walks without assistance   Muscle Strength: normal muscle tone   Orientation: oriented to time, place and person   Affect & Mood: appropriate and sustained affect   Language and Memory: patient responsive and seems to comprehend information   Neurologic Exam: neurological assessment grossly intact   T[place] l spine  facte load pos  Parkinsons gait    Other: moves all extremities       Assessment and Plan:    1. Lumbar degenerative disc disease     2. 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

## 2019-10-04 ENCOUNTER — Encounter: Admit: 2019-10-04 | Discharge: 2019-10-04 | Payer: MEDICARE

## 2019-10-04 NOTE — Telephone Encounter
Incoming call from pt received regarding medication.   Per pt, he states he missed a couple days of his meds and didn't notice any difference. He would like an appointment with Dr. Hetty Ely to discuss meds if they are not helping me    Pt scheduled for 10/24/2019 at 945am, pt aware

## 2019-10-24 ENCOUNTER — Ambulatory Visit: Admit: 2019-10-24 | Discharge: 2019-10-25 | Payer: MEDICARE

## 2019-10-24 ENCOUNTER — Encounter: Admit: 2019-10-24 | Discharge: 2019-10-24 | Payer: MEDICARE

## 2019-10-24 DIAGNOSIS — I10 Essential (primary) hypertension: Secondary | ICD-10-CM

## 2019-10-24 DIAGNOSIS — G20 Parkinson's disease: Secondary | ICD-10-CM

## 2019-10-24 MED ORDER — ROPINIROLE 0.5 MG PO TAB
ORAL_TABLET | Freq: Three times a day (TID) | ORAL | 1 refills | 30.00000 days | Status: AC
Start: 2019-10-24 — End: ?

## 2019-10-24 MED ORDER — ROPINIROLE 0.25 MG PO TAB
ORAL_TABLET | Freq: Three times a day (TID) | ORAL | 1 refills | 30.00000 days | Status: AC
Start: 2019-10-24 — End: ?

## 2019-10-24 NOTE — Progress Notes
Date of Service: 10/24/2019    Subjective:             Mitchell Burnett is a 69 y.o. male.    History of Present Illness    He is seen today for follow up of his PD. His last visit was April 2020. He has excessive sleepiness when he takes hs CD/LD and thus does not notice much benefit. He takes it only BID at this time.    He has problems with tremor, slowness of movements, stiffness and balancel    Review of Systems   Constitutional: Positive for appetite change, fatigue and unexpected weight change.   Gastrointestinal: Positive for constipation.   Musculoskeletal: Positive for back pain and gait problem.   Neurological: Positive for tremors.   Psychiatric/Behavioral: Positive for agitation. The patient is nervous/anxious.    All other systems reviewed and are negative.    Medications:  ? Apple Cider Vinegar 300 mg tab Take 1 tablet by mouth daily.   ? benztropine (COGENTIN) 0.5 mg tablet TAKE 1 TABLET BY MOUTH TWICE DAILY FOR  TREMOR   ? carbidopa/levodopa (SINEMET) 25/100 mg tablet 1 1/2 pills three times a day   ? cholecalciferol (VITAMIN D-3) 1,000 units tablet Take 1,000 Units by mouth daily.   ? docusate (COLACE) 100 mg capsule Take one capsule by mouth twice daily as needed for Constipation.   ? doxazosin (CARDURA) 8 mg tablet Take 8 mg by mouth at bedtime daily.   ? hydroCHLOROthiazide (HYDRODIURIL) 25 mg tablet Take 25 mg by mouth every morning.   ? nabumetone (RELAFEN) 750 mg tablet Take two tablets by mouth daily.   ? ondansetron (ZOFRAN) 4 mg tablet Take one tablet by mouth every 8 hours as needed for Nausea or Vomiting.   ? oxyCODONE (ROXICODONE) 5 mg tablet Take one tablet to two tablets by mouth every 4 hours as needed for Pain   ? rosuvastatin (CRESTOR) 10 mg tablet Take 10 mg by mouth at bedtime daily.   ? sildenafil(+) (VIAGRA) 50 mg tablet Take 1 Tab by mouth as Needed for Erectile dysfunction.       Objective:           Vitals:    10/24/19 1041   BP: (!) 149/87   BP Source: Arm, Right Upper Patient Position: Sitting   Pulse: 71   Weight: 74.8 kg (165 lb)   Height: 177.8 cm (70)   PainSc: Zero     Body mass index is 23.68 kg/m?Marland Kitchen     Physical Exam  Well developed, muscular male who appears younger than his age. He was accompanied by his wife.    UPDRS  Mentation, Behavior and Mood:    Primary source of information:: Patient (and wife, not caregiver)  Intellectual Impairment: Normal  Thought Disorder: Normal  Depression: Normal  Motivation / Initiative: Normal  Total Mentation Score: 0      Activities of Daily Living:    Speech: Normal  Salivation: Normal  Swallowing: Normal  Handwriting: Normal  Cutting Food and Handling Utensils: Normal  Dressing: Normal  Turning in Bed and Adjusting Bed Clothes: Normal  Falling (Unrelated to Freezing): Normal  Freezing when Walking: Normal  Walking: Slight  Tremor (Right): Mild  Tremor (Left): Mild  Sensory Complaints Related to Parkinsonism: Normal  Total Activities of Daily Living Score: 5    Motor Examination:    Speech: Mild  Facial Expression: Mild  Tremor at Rest Jaw: Normal  Tremor at Rest RUE: Moderate  Tremor at Rest LUE: Moderate  Tremor at Rest RLE: Normal  Tremor at Rest LLE: Normal  Action of Postural Tremor of Hands R: Normal  Action of Postural Tremor of Hands L: Normal  Rigidity NECK: Normal  Rigidity RUE: Slight  Rigidity LUE: Slight  Rigidity RLE: Normal  Rigidity LLE: Normal  Finger Taps L: Mild  Hand Movements R: Mild  Hand Movements L: Mild  Rapid Alternating Movement of Hands R: Mild  Rapid Alternating Movements of Hands L: Mild  Leg Agility with Knee Bent R: Slight  Leg Agility with Knee Bent L: Slight  Arising From Chair: Slight  Posture: Mild  Gait: Slight  Postural Stability: Slight  Body Bradykinesia and Hypokinesia: Mild  Total Motor Exam: 33    UPDRS Total Score:    Total Mentation Score: 0  Total Activities of Daily Living Score: 5  Total Motor Exam: 33  Total UPDRS Score: 38    Part 4:    Dyskinesia Duration: is 0%  Dyskinesia Disability: is 0%  Painful Dyskinesia: is 0%  Early AM Dystonia: is 0%  Predictable off period: is 0%  Unpredictable off period: is 0%  Off period suddenly: is 0%  Duration of off periods: is 0%  Total Part IV: 0  On Medications?: ON medications    Additional Scores:    H&Y Stage: 2  [place] and [place] Percent Disability: 80 %           Assessment and Plan:    Problem   Parkinson Disease (Hcc)    Between his last visit in 2014 and January 2017 he developed right hand bradykinesia and increased tone making the diagnosis of PD clear. He has experienced side effects, primarily severe headaches, nausea, vomiting and hallucinations with various dopaminergic agents including CD/LD, Azilect, Neurpro and ropinirole.     He remains incredibly active and this has been the best treatment for him.            Parkinson disease [facility])  He has not been able to take the CD/LD as directed due to sedation. I am having him stop the CD/LD and one week later to start ropinirole.    Encounter Medications   Medications   ? rOPINIRole (REQUIP) 0.25 mg tablet     Sig: Starting 10/31/19 take 1 TID for 1 wk, then 2 TID x 1 wk, then 3 TID for 1 wk, for PD     Dispense:  125 tablet     Refill:  1   ? rOPINIRole (REQUIP) 0.5 mg tablet     Sig: Starting 11/21/19 take 2 TID x 1 wk, then 3 TID x 1 wk, then 4 TID x 1 wk, then 5 TID x 1 wk, then 6 TID, for PD     Dispense:  450 tablet     Refill:  1     Patient Instructions   Stop the CD/LD, then one week later start the ropinirole    Please call Vicente Masson, LPN, my clinic nurse at [phone] if you have any questions or concerns. Or you may contact me through MyChart      Return in about 9 weeks (around 12/26/2019).

## 2019-10-27 ENCOUNTER — Encounter: Admit: 2019-10-27 | Discharge: 2019-10-27 | Payer: MEDICARE

## 2019-10-27 MED ORDER — BENZTROPINE 0.5 MG PO TAB
ORAL_TABLET | Freq: Two times a day (BID) | 0 refills
Start: 2019-10-27 — End: ?

## 2020-01-02 ENCOUNTER — Encounter: Admit: 2020-01-02 | Discharge: 2020-01-02 | Payer: MEDICARE

## 2020-01-13 ENCOUNTER — Encounter: Admit: 2020-01-13 | Discharge: 2020-01-13 | Payer: MEDICARE

## 2020-01-13 MED ORDER — ROPINIROLE 0.25 MG PO TAB
ORAL_TABLET | Freq: Three times a day (TID) | 0 refills
Start: 2020-01-13 — End: ?

## 2020-01-16 ENCOUNTER — Encounter: Admit: 2020-01-16 | Discharge: 2020-01-16 | Payer: MEDICARE

## 2020-01-16 MED ORDER — ROPINIROLE 0.5 MG PO TAB
ORAL_TABLET | Freq: Three times a day (TID) | ORAL | 0 refills | 30.00000 days | Status: AC
Start: 2020-01-16 — End: ?

## 2020-01-16 NOTE — Telephone Encounter
Received call from pt stating he is out of medication ropinirole. He states he is currently taking 2 0.5mg  tabs TID.   He cancelled follow up appointment.   This nurse advised for pt to continue on current dose and to come for appointment on 01/23/2020 at 1015.  Understanding verbalized.   Script sent to pharmacy.

## 2020-01-23 ENCOUNTER — Encounter: Admit: 2020-01-23 | Discharge: 2020-01-23 | Payer: MEDICARE

## 2020-01-23 NOTE — Telephone Encounter
Pt scheduled for f/u appointment today 01/23/2020 for ropinirole.   Pt showed up to appointment 1 hour late, He cancelled previous appointment on 01/02/2020.  Pt was given appointment on 02/26/2019 for follow up.

## 2020-02-08 ENCOUNTER — Encounter: Admit: 2020-02-08 | Discharge: 2020-02-08 | Payer: MEDICARE

## 2020-02-08 MED ORDER — BENZTROPINE 0.5 MG PO TAB
ORAL_TABLET | Freq: Two times a day (BID) | 0 refills
Start: 2020-02-08 — End: ?

## 2020-02-20 ENCOUNTER — Encounter: Admit: 2020-02-20 | Discharge: 2020-02-20 | Payer: MEDICARE

## 2020-02-20 NOTE — Telephone Encounter
Received VM from pt stating he is trying to reschedule his appointment on jan 18.   Appointment was cancelled by the patient as he did not think he could make it.   This nurse returned call to pt who states he needs his appt back. He states he will be able to make the appointment.   This nurse explained to pt, the late policy.   He verbalized understanding advised this is the 3rd time pt has cancelled appointment.

## 2020-02-26 ENCOUNTER — Ambulatory Visit: Admit: 2020-02-26 | Discharge: 2020-02-27 | Payer: MEDICARE

## 2020-02-26 ENCOUNTER — Encounter: Admit: 2020-02-26 | Discharge: 2020-02-26 | Payer: MEDICARE

## 2020-02-26 ENCOUNTER — Encounter: Admit: 2020-01-23 | Discharge: 2020-01-23 | Payer: MEDICARE

## 2020-02-26 DIAGNOSIS — I10 Essential (primary) hypertension: Secondary | ICD-10-CM

## 2020-02-26 DIAGNOSIS — G20 Parkinson's disease: Principal | ICD-10-CM

## 2020-02-26 MED ORDER — SELEGILINE HCL 5 MG PO TAB
5 mg | ORAL_TABLET | Freq: Two times a day (BID) | ORAL | 5 refills | 90.00000 days | Status: AC
Start: 2020-02-26 — End: ?

## 2020-02-26 MED ORDER — CELECOXIB 200 MG PO CAP
200 mg | ORAL_CAPSULE | Freq: Every day | ORAL | 1 refills | 30.00000 days | Status: AC
Start: 2020-02-26 — End: ?

## 2020-02-28 ENCOUNTER — Encounter: Admit: 2020-02-28 | Discharge: 2020-02-28 | Payer: MEDICARE

## 2020-02-28 DIAGNOSIS — G20 Parkinson's disease: Secondary | ICD-10-CM

## 2020-02-28 DIAGNOSIS — I10 Essential (primary) hypertension: Secondary | ICD-10-CM

## 2020-03-20 MED ORDER — BENZTROPINE 0.5 MG PO TAB
ORAL_TABLET | Freq: Two times a day (BID) | 0 refills
Start: 2020-03-20 — End: ?

## 2020-04-25 ENCOUNTER — Encounter: Admit: 2020-04-25 | Discharge: 2020-04-25 | Payer: MEDICARE

## 2020-04-25 MED ORDER — BENZTROPINE 0.5 MG PO TAB
ORAL_TABLET | Freq: Two times a day (BID) | ORAL | 11 refills | 30.00000 days | Status: AC
Start: 2020-04-25 — End: ?

## 2020-05-07 ENCOUNTER — Encounter: Admit: 2020-05-07 | Discharge: 2020-05-07 | Payer: MEDICARE

## 2020-05-07 NOTE — Telephone Encounter
Received call from pt who states he has stopped taking the selegiline, he states the medication was giving him generalized joint pain.   He stopped the medication last Friday, since then, his joint pain has subsided.   Routed to Dr. Hetty Ely

## 2020-05-15 ENCOUNTER — Encounter: Admit: 2020-05-15 | Discharge: 2020-05-15 | Payer: MEDICARE

## 2020-05-15 NOTE — Telephone Encounter
Restart CD/D 25/100 1/2 pill three times a day before meals

## 2020-05-15 NOTE — Telephone Encounter
Received call from pt who states he would like to be put back on a medication for his tremors. He states he would like to be put back on either ropinirole or CD/LD since the selegiline did not work for him.     Routed to Dr. Hetty Ely to advise

## 2020-07-20 ENCOUNTER — Encounter: Admit: 2020-07-20 | Discharge: 2020-07-20 | Payer: MEDICARE

## 2020-07-20 MED ORDER — CELECOXIB 200 MG PO CAP
200 mg | ORAL_CAPSULE | Freq: Every day | ORAL | 0 refills
Start: 2020-07-20 — End: ?

## 2020-08-18 ENCOUNTER — Encounter: Admit: 2020-08-18 | Discharge: 2020-08-18 | Payer: MEDICARE

## 2020-08-26 ENCOUNTER — Encounter: Admit: 2020-08-26 | Discharge: 2020-08-26 | Payer: MEDICARE

## 2020-08-26 ENCOUNTER — Ambulatory Visit: Admit: 2020-08-26 | Discharge: 2020-08-27 | Payer: MEDICARE

## 2020-08-26 DIAGNOSIS — G20 Parkinson's disease: Secondary | ICD-10-CM

## 2020-08-26 DIAGNOSIS — I10 Essential (primary) hypertension: Secondary | ICD-10-CM

## 2020-08-26 MED ORDER — AMANTADINE HCL 100 MG PO CAP
ORAL_CAPSULE | 5 refills | Status: AC
Start: 2020-08-26 — End: ?

## 2020-08-26 NOTE — Patient Instructions
In one month, please call my clinic nurse at [phone] with a status report or sooner if you have any questions or concerns. Or you may contact us through MyChart

## 2020-08-26 NOTE — Progress Notes
Date of Service: 08/26/2020    Subjective:             Mitchell Burnett is a 70 y.o. male.    History of Present Illness    He is brought in today by his son for follow up of his PD. He varies the dose of CD/LD depending on how active he will be. He takes a full tablet when he does not have much to do because he knows that he will be too sleepy.  He does not notice much benefit from his medications. The tremor is present an it bothersome at times. Walking is a bit more difficult and he has not had any recent falls.  He did not complain of hallucinations or sleep problem.s    Review of Systems   Constitutional: Positive for appetite change.   Musculoskeletal: Positive for gait problem.   Neurological: Positive for tremors.   All other systems reviewed and are negative.    Medications:  ? allopurinoL (ZYLOPRIM) 100 mg tablet Take 100 mg by mouth daily. Take with food.   ? Apple Cider Vinegar 300 mg tab Take 1 tablet by mouth daily.   ? benztropine (COGENTIN) 0.5 mg tablet TAKE 1 TABLET BY MOUTH TWICE DAILY FOR  TREMOR   ? carbidopa/levodopa (SINEMET) 25/100 mg tablet Take one-half tablet by mouth three times daily.   ? celecoxib (CELEBREX) 200 mg capsule Take 1 capsule by mouth once daily   ? cholecalciferol (VITAMIN D-3) 1,000 units tablet Take 1,000 Units by mouth daily.   ? docusate (COLACE) 100 mg capsule Take one capsule by mouth twice daily as needed for Constipation.   ? doxazosin (CARDURA) 8 mg tablet Take 8 mg by mouth at bedtime daily.   ? hydroCHLOROthiazide (HYDRODIURIL) 25 mg tablet Take 25 mg by mouth every morning.   ? ondansetron (ZOFRAN) 4 mg tablet Take one tablet by mouth every 8 hours as needed for Nausea or Vomiting.   ? oxyCODONE (ROXICODONE) 5 mg tablet Take one tablet to two tablets by mouth every 4 hours as needed for Pain   ? rosuvastatin (CRESTOR) 10 mg tablet Take 10 mg by mouth at bedtime daily.   ? sildenafil(+) (VIAGRA) 50 mg tablet Take 1 Tab by mouth as Needed for Erectile dysfunction. Objective:           Vitals:    08/26/20 1400   BP: 121/68   BP Source: Arm, Left Upper   Pulse: 75   PainSc: Zero   Weight: 73.5 kg (162 lb)   Height: 177.8 cm (5' 10)     Body mass index is 23.24 kg/m?Marland Kitchen     Physical Exam    Well developed, tall thin gentleman seated in the examination room with his son.    UPDRS  Mentation, Behavior and Mood:    Primary source of information:: Patient and [place] in [place]  Intellectual Impairment: Normal  Thought Disorder: Normal  Depression: Normal  Motivation / Initiative: Normal  Total Mentation Score: 0      Activities of Daily Living:    Speech: Normal  Salivation: Normal  Swallowing: Normal  Handwriting: Normal  Cutting Food and Handling Utensils: Normal  Dressing: Slight  Turning in Bed and Adjusting Bed Clothes: Normal  Falling (Unrelated to Freezing): Slight  Freezing when Walking: Normal  Walking: Slight  Tremor (Right): Slight  Tremor (Left): Slight  Sensory Complaints Related to Parkinsonism: Normal  Total Activities of Daily Living Score: 5  Motor Examination:    Speech: Mild  Facial Expression: Moderate  Tremor at Rest Jaw: Normal  Tremor at Rest RUE: Mild  Tremor at Rest LUE: Slight  Tremor at Rest RLE: Normal  Tremor at Rest LLE: Normal  Action of Postural Tremor of Hands R: Normal  Action of Postural Tremor of Hands L: Normal  Rigidity NECK: Normal  Rigidity RUE: Mild  Rigidity LUE: Slight  Rigidity RLE: Normal  Rigidity LLE: Normal  Finger Taps L: Slight  Hand Movements R: Mild  Hand Movements L: Slight  Rapid Alternating Movement of Hands R: Mild  Rapid Alternating Movements of Hands L: Slight  Leg Agility with Knee Bent R: Slight  Leg Agility with Knee Bent L: Slight  Arising From Chair: Slight  Posture: Mild  Gait: Mild  Postural Stability: Mild  Body Bradykinesia and Hypokinesia: Mild  Total Motor Exam: 31    UPDRS Total Score:    Total Mentation Score: 0  Total Activities of Daily Living Score: 5  Total Motor Exam: 31  Total UPDRS Score: 36    Part 4:    Dyskinesia Duration: is 0%  Dyskinesia Disability: is 0%  Painful Dyskinesia: is 0%  Early AM Dystonia: is 0%  Predictable off period: is 0%  Unpredictable off period: is 0%  Off period suddenly: is 0%  Duration of off periods: is 0%  Total Part IV: 0  On Medications?: ON medications    Additional Scores:    H&Y Stage: 2  [place] and [place] Percent Disability: 80 %         Assessment and Plan:    Problem   Parkinson Disease (Hcc)    Between his last visit in 2014 and January 2017 he developed right hand bradykinesia and increased tone making the diagnosis of PD clear. He has experienced side effects, primarily severe headaches, nausea, vomiting, excessive sleepiness and hallucinations with various dopaminergic agents including CD/LD, Azilect, Neurpro and ropinirole.     He remains incredibly active and this has been the best treatment for him.            Parkinson disease (HCC)  He continues to have severe sleepiness when he takes CD/LD limiting how much benefit he receives. This was more severe on ropinirole.    I am adding on amantadine to see if this may be of benefit and not worsen his sedation    Encounter Medications   Medications   ? allopurinoL (ZYLOPRIM) 100 mg tablet     Sig: Take 100 mg by mouth daily. Take with food.   ? amantadine (SYMMETREL) 100 mg capsule     Sig: One at breakfast and one at lunch for PD     Dispense:  60 capsule     Refill:  5     Patient Instructions   In one month, please call my clinic nurse at [phone] with a status report or sooner if you have any questions or concerns. Or you may contact us through MyChart      Return in about 6 months (around 02/26/2021) for In-Person, Telehealth.      I will send a letter for him to have explaining PD should he be pulled over while driving

## 2020-08-27 NOTE — Assessment & Plan Note
He continues to have severe sleepiness when he takes CD/LD limiting how much benefit he receives. This was more severe on ropinirole.    I am adding on amantadine to see if this may be of benefit and not worsen his sedation    Encounter Medications   Medications    allopurinoL (ZYLOPRIM) 100 mg tablet     Sig: Take 100 mg by mouth daily. Take with food.    amantadine (SYMMETREL) 100 mg capsule     Sig: One at breakfast and one at lunch for PD     Dispense:  60 capsule     Refill:  5     Patient Instructions   In one month, please call my clinic nurse at [phone] with a status report or sooner if you have any questions or concerns. Or you may contact us through MyChart      Return in about 6 months (around 02/26/2021) for In-Person, Telehealth.

## 2020-10-07 ENCOUNTER — Encounter: Admit: 2020-10-07 | Discharge: 2020-10-07 | Payer: MEDICARE

## 2020-10-07 NOTE — Telephone Encounter
Medication: amantadine    Pt takes things 2 times a day morning and noon. Pt is able to go to sleep okay.     Pt would like to spread out medication to be taken at morning and around 4-5PM instead. Are you ok with this?

## 2020-10-08 NOTE — Telephone Encounter
That is fine

## 2020-10-09 ENCOUNTER — Encounter: Admit: 2020-10-09 | Discharge: 2020-10-09 | Payer: MEDICARE

## 2020-12-17 ENCOUNTER — Ambulatory Visit: Admit: 2020-12-17 | Discharge: 2020-12-17 | Payer: MEDICARE

## 2020-12-17 ENCOUNTER — Encounter: Admit: 2020-12-17 | Discharge: 2020-12-17 | Payer: MEDICARE

## 2020-12-17 DIAGNOSIS — G20 Parkinson's disease: Secondary | ICD-10-CM

## 2020-12-17 DIAGNOSIS — I10 Essential (primary) hypertension: Secondary | ICD-10-CM

## 2020-12-17 MED ORDER — CARBIDOPA-LEVODOPA 25-100 MG PO TAB
ORAL_TABLET | Freq: Three times a day (TID) | 11 refills | Status: AC
Start: 2020-12-17 — End: ?

## 2020-12-17 MED ORDER — AMANTADINE HCL 100 MG PO CAP
ORAL_CAPSULE | 11 refills | Status: AC
Start: 2020-12-17 — End: ?

## 2020-12-17 NOTE — Patient Instructions
To help with the constipation:  - Drink at least two quarts of liquid a day  - Increase the amounts of fresh fruits, vegetables, and legumes in your diet  - Eat more whole grains  - Avoid BRAT (bananasm rice, apples and toast)    You need more fiber in your diet. Women should consume 24-8 grams of fiber a day and men 35-38 grams of fiber a day.      To help you in meeting these goals once a week keep a diary of what you eat and drink for a day, looking up the fiber content of the different foods that you eat based on the nutrition label or an on-line source for fruits and vegetables.    The main idea is to get more non-digestable fiber in your diet along with two quarts of liquid a day    One good source for recipes with whole grains and legumes is [email or web address]    Please contact us through MyChart with any questions or concerns that you may have.

## 2020-12-17 NOTE — Progress Notes
Date of Service: 12/17/2020    Subjective:             Mitchell Burnett is a 70 y.o. male.    History of Present Illness    He returns today for follow up of his PD and his son is with him. This visit is earlier than planned since over the winter he will be visiting his children in [place] and in [place].    He has some benefit from his medicine and no wearing off. He is willing to increase his dose of CD/LD at this time. He did not complain of dyskinesias or sleep problems. He has fallen several times without severe injury    Review of Systems   Gastrointestinal: Positive for constipation.   Genitourinary: Positive for flank pain.   Musculoskeletal: Positive for back pain.   Neurological: Positive for tremors and numbness.   Psychiatric/Behavioral: The patient is nervous/anxious.    All other systems reviewed and are negative.    Medications:  ? allopurinoL (ZYLOPRIM) 100 mg tablet Take 100 mg by mouth daily. Take with food.   ? amantadine (SYMMETREL) 100 mg capsule One at breakfast and one in the afternoon for PD   ? Apple Cider Vinegar 300 mg tab Take 1 tablet by mouth daily.   ? benztropine (COGENTIN) 0.5 mg tablet TAKE 1 TABLET BY MOUTH TWICE DAILY FOR  TREMOR   ? carbidopa/levodopa (SINEMET) 25/100 mg tablet Take one-half tablet by mouth three times daily.   ? celecoxib (CELEBREX) 200 mg capsule Take 1 capsule by mouth once daily   ? cholecalciferol (VITAMIN D-3) 1,000 units tablet Take 1,000 Units by mouth daily.   ? docusate (COLACE) 100 mg capsule Take one capsule by mouth twice daily as needed for Constipation.   ? doxazosin (CARDURA) 8 mg tablet Take 8 mg by mouth at bedtime daily.   ? hydroCHLOROthiazide (HYDRODIURIL) 25 mg tablet Take 25 mg by mouth every morning.   ? ondansetron (ZOFRAN) 4 mg tablet Take one tablet by mouth every 8 hours as needed for Nausea or Vomiting.   ? oxyCODONE (ROXICODONE) 5 mg tablet Take one tablet to two tablets by mouth every 4 hours as needed for Pain   ? rosuvastatin (CRESTOR) 10 mg tablet Take 10 mg by mouth at bedtime daily.   ? sildenafil(+) (VIAGRA) 50 mg tablet Take 1 Tab by mouth as Needed for Erectile dysfunction.       Objective:           Vitals:    12/17/20 1240   PainSc: Zero   Weight: 78 kg (172 lb)   Height: 177.8 cm (5' 10)     Body mass index is 24.68 kg/m?Marland Kitchen     Physical Exam  Well developed, tall, fit gentleman seated in the exam room with his son.    UPDRS  Mentation, Behavior and Mood:    Primary source of information:: Patient and [place] in Equal Proportion  Intellectual Impairment: Normal  Thought Disorder: Normal  Depression: Normal  Motivation / Initiative: Normal  Total Mentation Score: 0      Activities of Daily Living:    Speech: Normal  Salivation: Normal  Swallowing: Normal  Handwriting: Normal  Cutting Food and Handling Utensils: Normal  Dressing: Mild  Turning in Bed and Adjusting Bed Clothes: Normal  Falling (Unrelated to Freezing): Slight  Freezing when Walking: Normal  Walking: Slight  Tremor (Right): Slight  Tremor (Left): Slight  Sensory Complaints Related to Parkinsonism: Normal  Total Activities of Daily Living Score: 6    Motor Examination:    Speech: Mild  Facial Expression: Moderate  Tremor at Rest Jaw: Normal  Tremor at Rest RUE: Mild  Tremor at Rest LUE: Slight  Tremor at Rest RLE: Normal  Tremor at Rest LLE: Normal  Action of Postural Tremor of Hands R: Normal  Action of Postural Tremor of Hands L: Normal  Rigidity NECK: Normal  Rigidity RUE: Mild  Rigidity LUE: Slight  Rigidity RLE: Normal  Rigidity LLE: Normal  Finger Taps L: Mild  Hand Movements R: Moderate  Hand Movements L: Mild  Rapid Alternating Movement of Hands R: Moderate  Rapid Alternating Movements of Hands L: Mild  Leg Agility with Knee Bent R: Mild  Leg Agility with Knee Bent L: Mild  Arising From Chair: Mild  Posture: Mild  Gait: Mild  Postural Stability: Mild  Body Bradykinesia and Hypokinesia: Moderate  Total Motor Exam: 41    UPDRS Total Score:    Total Mentation Score: 0  Total Activities of Daily Living Score: 6  Total Motor Exam: 41  Total UPDRS Score: 47    Part 4:    Dyskinesia Duration: is 0%  Dyskinesia Disability: is 0%  Painful Dyskinesia: is 0%  Early AM Dystonia: is 0%  Predictable off period: is 0%  Unpredictable off period: is 0%  Off period suddenly: is 0%  Duration of off periods: is 0%  Total Part IV: 0  On Medications?: ON medications    Additional Scores:    H&Y Stage: 3  [place] and [place] Percent Disability: 80 %         Assessment and Plan:    Problem   Parkinson Disease (Hcc)    Between his last visit in 2014 and January 2017 he developed right hand bradykinesia and increased tone making the diagnosis of PD clear. He has experienced side effects, primarily severe headaches, nausea, vomiting, excessive sleepiness and hallucinations with various dopaminergic agents including CD/LD, Azilect, Neurpro and ropinirole.     He remains incredibly active and this has been the best treatment for him.            Parkinson disease [facility])  He is still not doing as well as he could be. Today he is interested in increasing his dose of CD/LD and also asked about DBS for his resting tremor.    I am increasing his dose of CD/LD to a whole tablet TID    Encounter Medications   Medications   ? carbidopa/levodopa (SINEMET) 25/100 mg tablet     Sig: One pill three times a day before meals     Dispense:  90 tablet     Refill:  11   ? amantadine hcl (SYMMETREL) 100 mg capsule     Sig: One at breakfast and one in the afternoon for PD     Dispense:  60 capsule     Refill:  11     I am referring him to Dr Genevieve Norlander to discuss DBS    Return in about 6 months (around 06/16/2021) for In-Person.

## 2020-12-18 NOTE — Assessment & Plan Note
He is still not doing as well as he could be. Today he is interested in increasing his dose of CD/LD and also asked about DBS for his resting tremor.    I am increasing his dose of CD/LD to a whole tablet TID    Encounter Medications   Medications    carbidopa/levodopa (SINEMET) 25/100 mg tablet     Sig: One pill three times a day before meals     Dispense:  90 tablet     Refill:  11    amantadine hcl (SYMMETREL) 100 mg capsule     Sig: One at breakfast and one in the afternoon for PD     Dispense:  60 capsule     Refill:  11     I am referring him to Dr Genevieve Norlander to discuss DBS    Return in about 6 months (around 06/16/2021) for In-Person.

## 2021-01-23 ENCOUNTER — Encounter: Admit: 2021-01-23 | Discharge: 2021-01-23 | Payer: MEDICARE

## 2021-01-23 MED ORDER — CELECOXIB 200 MG PO CAP
200 mg | ORAL_CAPSULE | Freq: Every day | ORAL | 0 refills | 30.00000 days | Status: AC
Start: 2021-01-23 — End: ?

## 2021-01-23 NOTE — Telephone Encounter
Received refill request for CELEBREX.  Medication is listed to continue under plan of care from last visit.  Patient has been seen in the last year and has a future appointment scheduled.  Refills authorized with neurologist to co-sign.

## 2021-03-02 ENCOUNTER — Encounter: Admit: 2021-03-02 | Discharge: 2021-03-02 | Payer: MEDICARE

## 2021-03-02 NOTE — Telephone Encounter
Amantadine follow-up. Patient cannot sleep. He wakes up at 2:45 AM every night. He tried changing the time when he took it (switched taking it at morning time) and that did not help. Patient would like to come off of the medication completely and would like to try something new.    Once this medication situation is sorted, he would like do physical therapy/occupational therapy but he would like them to be more spread out.

## 2021-03-02 NOTE — Telephone Encounter
He may decrease the amantadine to one in the afternoon for three days and then stop

## 2021-03-04 ENCOUNTER — Encounter: Admit: 2021-03-04 | Discharge: 2021-03-04 | Payer: MEDICARE

## 2021-03-04 NOTE — Progress Notes
PA renewal approved for Benztropine Mesylate 0.5MG  tablets

## 2021-03-05 ENCOUNTER — Encounter: Admit: 2021-03-05 | Discharge: 2021-03-05 | Payer: MEDICARE

## 2021-03-05 NOTE — Progress Notes
PA for Benztropine approved.

## 2021-03-11 ENCOUNTER — Encounter: Admit: 2021-03-11 | Discharge: 2021-03-11 | Payer: MEDICARE

## 2021-04-30 ENCOUNTER — Encounter: Admit: 2021-04-30 | Discharge: 2021-04-30 | Payer: MEDICARE

## 2021-04-30 MED ORDER — CELECOXIB 200 MG PO CAP
200 mg | ORAL_CAPSULE | Freq: Every day | ORAL | 0 refills | 30.00000 days | Status: AC
Start: 2021-04-30 — End: ?

## 2021-04-30 NOTE — Telephone Encounter
Received refill request for celebrex.  Medication is listed to continue under plan of care from last visit.  Patient has been seen in the last year and has a future appointment scheduled.  Refills authorized with neurologist to co-sign.

## 2021-05-13 ENCOUNTER — Encounter: Admit: 2021-05-13 | Discharge: 2021-05-13 | Payer: MEDICARE

## 2021-05-13 MED ORDER — BENZTROPINE 0.5 MG PO TAB
ORAL_TABLET | 0 refills
Start: 2021-05-13 — End: ?

## 2021-07-14 ENCOUNTER — Encounter: Admit: 2021-07-14 | Discharge: 2021-07-14 | Payer: MEDICARE

## 2021-07-28 ENCOUNTER — Encounter: Admit: 2021-07-28 | Discharge: 2021-07-28 | Payer: MEDICARE

## 2021-07-28 MED ORDER — CELECOXIB 200 MG PO CAP
200 mg | ORAL_CAPSULE | Freq: Every day | ORAL | 0 refills
Start: 2021-07-28 — End: ?

## 2021-08-04 ENCOUNTER — Encounter: Admit: 2021-08-04 | Discharge: 2021-08-04 | Payer: MEDICARE

## 2021-08-04 ENCOUNTER — Ambulatory Visit: Admit: 2021-08-04 | Discharge: 2021-08-05 | Payer: MEDICARE

## 2021-08-04 DIAGNOSIS — G20 Parkinson's disease: Secondary | ICD-10-CM

## 2021-08-04 DIAGNOSIS — I10 Essential (primary) hypertension: Secondary | ICD-10-CM

## 2021-08-04 MED ORDER — CARBIDOPA-LEVODOPA 25-100 MG PO TAB
ORAL_TABLET | 11 refills | Status: AC
Start: 2021-08-04 — End: ?

## 2021-08-04 NOTE — Progress Notes
Telehealth Visit Note    Date of Service: 08/04/2021    Subjective:           Mitchell Burnett is a 71 y.o. male.    History of Present Illness    He is seen for scheduled follow up of his PD. He feels that he is doing well. His wife insisted that he tell me about recent falls and while he states he has only had two, their neighbors and mail carrier look out for him if he does fall.      Review of Systems   Constitutional: Negative.    HENT: Negative.    Eyes: Negative.    Respiratory: Negative.    Cardiovascular: Negative.    Gastrointestinal: Negative.    Endocrine: Negative.    Genitourinary: Negative.    Musculoskeletal: Negative.    Skin: Negative.    Allergic/Immunologic: Negative.    Neurological: Positive for tremors.   Hematological: Negative.    Psychiatric/Behavioral: Negative.        .  Medications:         ? allopurinoL (ZYLOPRIM) 100 mg tablet Take one tablet by mouth daily. Take with food.   ? amantadine hcl (SYMMETREL) 100 mg capsule Stopped a few months ago   ? Apple Cider Vinegar 300 mg tab Take one tablet by mouth daily.   ? benztropine (COGENTIN) 0.5 mg tablet TAKE 1 TABLET BY MOUTH TWICE DAILY FOR  TREMOR   ? carbidopa/levodopa (SINEMET) 25/100 mg tablet One pill three times a day before meals   ? celecoxib (CELEBREX) 200 mg capsule Take 1 capsule by mouth once daily   ? cholecalciferol (VITAMIN D-3) 1,000 units tablet Take one tablet by mouth daily.   ? docusate (COLACE) 100 mg capsule Take one capsule by mouth twice daily as needed for Constipation.   ? doxazosin (CARDURA) 8 mg tablet Take one tablet by mouth at bedtime daily.   ? hydroCHLOROthiazide (HYDRODIURIL) 25 mg tablet Take one tablet by mouth every morning.   ? oxyCODONE (ROXICODONE) 5 mg tablet Take one tablet to two tablets by mouth every 4 hours as needed for Pain   ? rosuvastatin (CRESTOR) 10 mg tablet Take one tablet by mouth at bedtime daily.   ? sildenafil(+) (VIAGRA) 50 mg tablet Take 1 Tab by mouth as Needed for Erectile dysfunction.          Telehealth Patient Reported Vitals     Row Name 08/04/21 1542                Weight: 76.2 kg (168 lb)        Height: 177.8 cm (5' 10)        Pain Score: Zero                  Telehealth Body Mass Index: 24.11 at 08/04/2021  8:06 PM    Physical Exam    He was seated in his home with his wife near by    His tremor was moderately severe making it difficult for him to hold the phone so that I could see him       Assessment and Plan:    Problem   Parkinson Disease (Hcc)    Between his last visit in 2014 and January 2017 he developed right hand bradykinesia and increased tone making the diagnosis of PD clear. He has experienced side effects, primarily severe headaches, nausea, vomiting, excessive sleepiness and hallucinations with various dopaminergic agents including CD/LD, Azilect, Neurpro  and ropinirole.     He remains incredibly active and this has been the best treatment for him.           Parkinson disease [facility])  He is falling more and is willing to increase his dose of CD/LD. He has stopped the amantadine due to lack of benefit.    His vision is blurred and not improving with changes in his lens We may need to stop his benztropine.    Encounter Medications   Medications   ? carbidopa/levodopa (SINEMET) 25/100 mg tablet     Sig: One and 1/2  pill three times a day. 7 am, 11 am and between 4 and 5 pm     Dispense:  405 tablet     Refill:  11     Patient Instructions   Please contact us through MyChart with any questions or concerns that you may have.    At your next visit I will start to taper you off of the benztropine      Return in about 4 months (around 12/04/2021) for In-Person.

## 2021-08-05 NOTE — Assessment & Plan Note
He is falling more and is willing to increase his dose of CD/LD. He has stopped the amantadine due to lack of benefit.    His vision is blurred and not improving with changes in his lens We may need to stop his benztropine.    Encounter Medications   Medications    carbidopa/levodopa (SINEMET) 25/100 mg tablet     Sig: One and 1/2  pill three times a day. 7 am, 11 am and between 4 and 5 pm     Dispense:  405 tablet     Refill:  11     Patient Instructions   Please contact us through MyChart with any questions or concerns that you may have.    At your next visit I will start to taper you off of the benztropine      Return in about 4 months (around 12/04/2021) for In-Person.

## 2021-09-22 ENCOUNTER — Encounter: Admit: 2021-09-22 | Discharge: 2021-09-22 | Payer: MEDICARE

## 2021-09-22 NOTE — Telephone Encounter
Called pt and he confirmed he received our new pt packet. Stated he was referred to [place] by Dr. Hetty Ely. Denied any other neurologists or imaging done in the last 5 years. Gave pt information of parking, building, and mask requirement, along with my direct line for further questions.    Records from Dr. Hetty Ely are in the chart.    Imaging in the chart.

## 2021-09-30 ENCOUNTER — Encounter: Admit: 2021-09-30 | Discharge: 2021-09-30 | Payer: MEDICARE

## 2021-09-30 ENCOUNTER — Ambulatory Visit: Admit: 2021-09-30 | Discharge: 2021-10-01 | Payer: MEDICARE

## 2021-09-30 VITALS — BP 85/55 | HR 75

## 2021-09-30 VITALS — BP 107/65 | HR 67 | Ht 67.717 in | Wt 156.0 lb

## 2021-09-30 DIAGNOSIS — R259 Unspecified abnormal involuntary movements: Secondary | ICD-10-CM

## 2021-09-30 DIAGNOSIS — H269 Unspecified cataract: Secondary | ICD-10-CM

## 2021-09-30 DIAGNOSIS — H547 Unspecified visual loss: Secondary | ICD-10-CM

## 2021-09-30 DIAGNOSIS — G20 Parkinson's disease: Secondary | ICD-10-CM

## 2021-09-30 DIAGNOSIS — G259 Extrapyramidal and movement disorder, unspecified: Secondary | ICD-10-CM

## 2021-09-30 DIAGNOSIS — I10 Essential (primary) hypertension: Secondary | ICD-10-CM

## 2021-09-30 DIAGNOSIS — M1990 Unspecified osteoarthritis, unspecified site: Secondary | ICD-10-CM

## 2021-09-30 NOTE — Progress Notes
Reason for visit:  Mitchell Burnett is 71 y.o. left handed male who presents today for evaluation of PD. Accompanied by: son who also aids in providing history.    On review of records prior to today's visit, Mitchell Burnett has a diagnosis of PD with initial symptoms being right hand tremor in 2011-2012. He initially saw Mitchell Burnett in 2013 but was diagnosed with PD in 2017 after he developed right sided bradykinesia and rigidity. He was placed on carbidopa/levodopa. He also is on benztropine. He is here to discuss possible DBS for his right hand tremor.    Current medications related to present condition:   7:30-8am 12pm 10pm     CD/LD IR 25/100 mg  1.5 1.5 1.5     Benztropine 0.5mg  BID    He thinks carbidopa/levodopa helps tremor ~50%. He notices it kicks in after 30 minutes. The medications only last 2-3 hours. He was previously was on carbidopa/levodopa IR 25/100mg  3 tablets TID for 1 month and he had adverse effects including blurry vision. He has been on his current dose of carbidopa/levodopa for the past 1.5 years.    Carbidopa/levodopa makes him fatigued 30 minutes after taking the medication. He tries to push through this instead of napping.    He has rare dyskinesias in the evening (lasting a few minutes at a time).    Prior medications related to present condition:  Name of medication Reason (if known)   Amantadine Lack of benefit               Review of nonmotor symptoms:  Constipation: He reports constipation, with a bowel movement once every 3 days. He tries docusate, miralax.    He has not been on any antipsychotic medications, anti-seizure medications nor any antiemetic medications.    No family history of PD.    Parkinson's Disease Follow-Up Visit    Since my last visit I am: Unchanged    Symptoms Scale:    Memory problems: Slight, not bothersome  Hallucinations/delusions: Slight, sense of presence  Depression: None  Anxiety: None  Apathy: Slight, some loss of interest  Impulsive behavior: Slight, urges under control  Nighttime sleep: No difficulty  Daytime sleepiness: Slight, occasionally  Vivid dreams: Slight, occasional  REM sleep behavior disorder: None  Restless leg syndrome: None  Pain or muscle cramps: None  Urination: Slight, go frequently, not bothersome  Constipation: Marked, need prescription medication  Dizziness or lightheadedness: Slight, occasional on standing  Tiredness/Fatigue: Slight, reduced stamina  Falling: Slight, rarely during the year  Personal care assistance: Slight, I take care of myself, but several activities take me longer to complete    Total Score:  Total Score: 15    Assistive Devices:  Assistive devices for getting around: None    OFF Time:    OFF time: No    Dyskinesia:    Dyskinesia while awake: Yes  Hours/day of dyskinesia: 0  Number of episodes/day: 4  Dyskinesia disability: Do not bother me but bothers my partner    Employment Status:    Employment: Retired - not due to PD    Review of Systems   Neurological: Positive for dizziness and tremors.   All other systems reviewed and are negative.  ?      Medications:   Current Outpatient Medications on File Prior to Visit   Medication Sig Dispense Refill   ? allopurinoL (ZYLOPRIM) 100 mg tablet Take one tablet by mouth daily. Take with food.     ?  Apple Cider Vinegar 300 mg tab Take one tablet by mouth daily.     ? benztropine (COGENTIN) 0.5 mg tablet TAKE 1 TABLET BY MOUTH TWICE DAILY FOR  TREMOR 180 tablet 3   ? carbidopa/levodopa (SINEMET) 25/100 mg tablet One and 1/2  pill three times a day. 7 am, 11 am and between 4 and 5 pm 405 tablet 11   ? celecoxib (CELEBREX) 200 mg capsule Take 1 capsule by mouth once daily 90 capsule 0   ? cholecalciferol (VITAMIN D-3) 1,000 units tablet Take one tablet by mouth daily.     ? docusate (COLACE) 100 mg capsule Take one capsule by mouth twice daily as needed for Constipation. 50 capsule 0   ? doxazosin (CARDURA) 8 mg tablet Take one tablet by mouth at bedtime daily.     ? hydroCHLOROthiazide (HYDRODIURIL) 25 mg tablet Take one tablet by mouth every morning.     ? oxyCODONE (ROXICODONE) 5 mg tablet Take one tablet to two tablets by mouth every 4 hours as needed for Pain 50 tablet 0   ? rosuvastatin (CRESTOR) 10 mg tablet Take one tablet by mouth at bedtime daily.     ? sildenafil(+) (VIAGRA) 50 mg tablet Take 1 Tab by mouth as Needed for Erectile dysfunction. 10 Tab 5     No current facility-administered medications on file prior to visit.       Past Medical History:    Medical History:   Diagnosis Date   ? Abnormal involuntary movement    ? Arthritis    ? Cataract    ? Hypertension    ? Movement disorder    ? Parkinson disease (HCC)    ? Vision decreased         Social History:    Social History     Socioeconomic History   ? Marital status: Married   Tobacco Use   ? Smoking status: Never   ? Smokeless tobacco: Never   Substance and Sexual Activity   ? Alcohol use: Yes     Alcohol/week: 0.0 standard drinks of alcohol     Comment: 2-3 drinks per week   ? Drug use: No       Family History:   Family History   Problem Relation Age of Onset   ? Cancer Mother    ? Cancer Brother    ? Diabetes Neg Hx    ? Gout Neg Hx    ? SLE Neg Hx    ? Stroke Neg Hx    ? Psoriasis Neg Hx    ? Heart Disease Neg Hx    ? Kidney Disease Neg Hx    ? Parkinson's  Neg Hx    ? Tremor Neg Hx         Allergies:   Allergies   Allergen Reactions   ? Pcn [Penicillins] UNKNOWN         PHYSICAL EXAMINATION:      VITAL SIGNS:   Vitals:    09/30/21 0808 09/30/21 0819   BP: 107/65 (!) 85/55   BP Source: Arm, Left Upper Arm, Left Upper   Patient Position: Sitting Standing   Pulse: 67 75       The last dosage of medication was 10.5 hours ago.    NEUROLOGICAL EXAM:    Mental Status: Alert and oriented x 3, fluent speech, comprehension intact, normal attention, adequate fund of knowledge, remote and short term memory intact    Cranial Nerves:  II: PERRL, VFFTC  III, IV, VI: EOMI    Smooth pursuit and saccades were within normal limits    V: sensation intact over face to light touch bilaterally  VII: face symmetric  VIII: hearing intact to finger rubs bilaterally  IX, X: uvula/palate midline  XI: SCM and trapezius 5/5, shoulder shrug symmetric  XII: tongue midline    Motor:  Bulk: Normal bulk throughout  Strength: 5/5 throughout in UE and LE bilaterally  Tone: As per UPDRS below  Reflexes: 2+ and symmetric throughout biceps, brachioradialis, triceps, patellar and 1+ achilles reflexes    Sensation:  Intact to light touch in all four extremities    Coordination:  Intact to FTN in UE bilaterally, LE intact to HTS BL    Movement Examination:  No dyskinesia  No dystonia  See UPDRS below    UPDRS Motor:     Speech: 2 - Monotone, slurred but understandable- moderately impaired.  Facial Expression: 2 - Slight but definitely abnormal diminution of facial expression.  Tremor at Rest Jaw: 1 - Slight and infrequently present.  Tremor at Rest RUE: 2 - Moderate in amplitude, but only intermittently present.  Tremor at Rest LUE: 2 - Moderate in amplitude, but only intermittently present.  Tremor at Rest RLE: 0 - Absent  Tremor at Rest LLE: 0 - Absent  Action of Postural Tremor of Hands R: 0 - Absent  Action of Postural Tremor of Hands L: 0 - Absent  Rigidity NECK: 2 - Mild to moderate.  Rigidity RUE: 2 - Mild to moderate.  Rigidity LUE: 2 - Mild to moderate.  Rigidity RLE: 2 - Mild to moderate  Rigidity LLE: 2 - Mild to moderate.  Finger Taps L: 1 - Mild slowing and/or reduction in amplitude (11-14/5 sec)  Hand Movements R: 1 - Mild slowing and/or reduction in amplitude.  Hand Movements L: 1 - Mild slowing and/or reduction in amplitude.  Rapid Alternating Movement of Hands R: 1 - Mild slowing and/or reduction in amplitude.  Rapid Alternating Movements of Hands L: 0 - Normal  Leg Agility with Knee Bent R: 0 - Normal  Leg Agility with Knee Bent L: 0 - Normal  Arising From Chair: 1 - Slow, or may need more than one attempt.  Posture: 2 - Moderately stooped posture, definitely abnormal- can be slightly to one side.  Gait: 2 - Walks with difficulty, but requires little or no assistance- may have some festination short steps, or propulsion.  Postural Stability: 2 - Absence of postural response- would fall if not caught by examiner.  Body Bradykinesia and Hypokinesia: 2 - Mild degree of slowness and poverty of movement which is definitely abnormal. Alternatively, some reduced amplitude.  Total Motor Exam: 34    Gait:  Mildly decreased stride length and Normal stance width.  Reduced left and Reduced right  arm swing.  Left and Right re-emergent upper extremity tremor.    Assessment/Plan:    Mitchell Burnett is a 71 y.o. male who presents today for evaluation of PD. 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     We discussed DBS in detail including the screening process, the benefits and risks of  surgery including intracranial bleed, stroke, weakness, infection, worsening of cognition, post operative confusion and psychosis, lead misplacement, and behavior changes.    For his tremor, I think he would be a good candidate for bilateral STN DBS. This can be awake or asleep but I would prefer asleep with bilateral simultaneous implantation to help with tremor on both sides. I would suggest a [employer] Percept PC device with sensing leads for him.    We discussed OFF/ON testing and neuropsychology testing. We discussed that balance may or may not be improved with DBS and it would depend on his benefit to levodopa (if his balance improves with levodopa, it is possible that DBS may improve those symptoms as well).    We discussed Parkinson's disease in detail, including management options and prognosis. I discussed the importance of exercise and advised regular physical activity and further education on this.    Problem List Items Addressed This Visit        NEURO    Parkinson disease (HCC) - Primary    Relevant Orders    AMB REFERRAL TO NEUROSURGERY    AMB REFERRAL TO NEUROPSYCHOLOGY     - Continue carbidopa/levodopa IR 25/100mg  1.5 tablets TID  - Continue benztropine 0.5mg  BID  - Neurosurgery referral for bilateral simultaneous STN DBS  - Neuropsychology testing  - OFF/ON testing    To return to clinic in 2-3 months.       Total time spent 60 mins, including face-to-face with patient, chart review, documentation and coordination of care. >50% time spent in discussing diagnosis, plan of care and counseling, and answering patient and family questions.    Manfred Laspina Loong Catalina Lunger, MD, FRCPC  [facility]  [facility]

## 2021-10-01 DIAGNOSIS — G20 Parkinson's disease: Principal | ICD-10-CM

## 2021-11-09 ENCOUNTER — Encounter: Admit: 2021-11-09 | Discharge: 2021-11-09 | Payer: MEDICARE

## 2021-12-22 ENCOUNTER — Encounter: Admit: 2021-12-22 | Discharge: 2021-12-22 | Payer: MEDICARE

## 2021-12-22 MED ORDER — CELECOXIB 200 MG PO CAP
200 mg | ORAL_CAPSULE | Freq: Every day | ORAL | 3 refills | 30.00000 days | Status: AC
Start: 2021-12-22 — End: ?

## 2021-12-22 NOTE — Telephone Encounter
Received refill request for celebrex.  Medication is listed to continue under plan of care from last visit.  Patient has been seen in the last year and has a future appointment scheduled.  Refills authorized with neurologist to co-sign.

## 2021-12-30 ENCOUNTER — Encounter: Admit: 2021-12-30 | Discharge: 2021-12-30 | Payer: MEDICARE

## 2022-01-22 ENCOUNTER — Encounter: Admit: 2022-01-22 | Discharge: 2022-01-22 | Payer: MEDICARE

## 2022-01-22 ENCOUNTER — Ambulatory Visit: Admit: 2022-01-22 | Discharge: 2022-01-22 | Payer: MEDICARE

## 2022-01-22 DIAGNOSIS — G259 Extrapyramidal and movement disorder, unspecified: Secondary | ICD-10-CM

## 2022-01-22 DIAGNOSIS — M1990 Unspecified osteoarthritis, unspecified site: Secondary | ICD-10-CM

## 2022-01-22 DIAGNOSIS — H269 Unspecified cataract: Secondary | ICD-10-CM

## 2022-01-22 DIAGNOSIS — G20A1 Parkinson's disease without dyskinesia or fluctuating manifestations: Secondary | ICD-10-CM

## 2022-01-22 DIAGNOSIS — H547 Unspecified visual loss: Secondary | ICD-10-CM

## 2022-01-22 DIAGNOSIS — R259 Unspecified abnormal involuntary movements: Secondary | ICD-10-CM

## 2022-01-22 DIAGNOSIS — I10 Essential (primary) hypertension: Secondary | ICD-10-CM

## 2022-01-22 NOTE — Patient Instructions
Please contact us through MyChart with any questions or concerns that you may have.    I will contact Dr. Genevieve Norlander about getting you back on the schedule since you missed the initial one due to medical problems

## 2022-01-27 ENCOUNTER — Encounter: Admit: 2022-01-27 | Discharge: 2022-01-27 | Payer: MEDICARE

## 2022-01-28 ENCOUNTER — Encounter: Admit: 2022-01-28 | Discharge: 2022-01-28 | Payer: MEDICARE

## 2022-01-28 DIAGNOSIS — G20A1 Parkinson's disease without dyskinesia or fluctuating manifestations: Secondary | ICD-10-CM

## 2022-02-04 ENCOUNTER — Encounter: Admit: 2022-02-04 | Discharge: 2022-02-04 | Payer: MEDICARE

## 2022-02-11 ENCOUNTER — Encounter: Admit: 2022-02-11 | Discharge: 2022-02-11 | Payer: MEDICARE

## 2022-02-23 ENCOUNTER — Encounter: Admit: 2022-02-23 | Discharge: 2022-02-23 | Payer: MEDICARE

## 2022-02-23 NOTE — Telephone Encounter
Called pt and relayed OFF/ON instructions to hold carbidopa/levodopa IR 25/100 mg for 12 hours prior to the appt. Pt does not have a working Personal assistant or access to [employer].    Mailed packet with instructions.

## 2022-02-23 NOTE — Telephone Encounter
Message  Received: Today  Au, Oswald Hillock, MD  Charlett Blake, RN  Hi, I'd like to make this an OFF/ON DBS appointment. I know that we only have a 30 minute slot but we can keep it that way. I'll see him, do his OFF examination, then step out and see the next patient before returning to do the ON examination.    Judson Roch, are you able to contact this patient and let them know that I would like him OFF medications for this in preparation for possible DBS? Thank you.    Ka Loong Sharren Bridge, MD, FRCPC  [facility] [place] [facility]          Previous Messages       ----- Message -----  From: Ihor Austin  Sent: 01/28/2022  10:04 AM CST  To: Julious Oka, MD; Charlett Blake, RN    Offered patient sooner openings he declined and accept the following date for 03/22/2022 at 10:00am.  ----- Message -----  From: Julious Oka, MD  Sent: 01/28/2022   9:28 AM CST  To: Ihor Austin; Charlett Blake, RN    Can you schedule a follow-up at next earliest available for me? Thanks.    I have asked Neurosurgery to reschedule an appointment to discuss DBS. Once that is done, I will bring him in for an earlier OFF/ON but will let you know.    Ka Loong Sharren Bridge, MD, FRCPC  [facility] [place] [facility]    ----- Message -----  From: Ladora Daniel, MD  Sent: 01/23/2022  11:37 AM CST  To: Julious Oka, MD    Dannielle Huh,  He missed his follow up appointment last month due to a non-PD medical problem and he still wants to go ahead with DBS.    Can you get him back in soon to see you for a follow-up?    Thanks    [employer]

## 2022-03-12 ENCOUNTER — Encounter: Admit: 2022-03-12 | Discharge: 2022-03-12 | Payer: MEDICARE

## 2022-03-12 ENCOUNTER — Ambulatory Visit: Admit: 2022-03-12 | Discharge: 2022-03-13 | Payer: MEDICARE

## 2022-03-12 DIAGNOSIS — R259 Unspecified abnormal involuntary movements: Secondary | ICD-10-CM

## 2022-03-12 DIAGNOSIS — M1990 Unspecified osteoarthritis, unspecified site: Secondary | ICD-10-CM

## 2022-03-12 DIAGNOSIS — G20A1 Parkinson disease: Secondary | ICD-10-CM

## 2022-03-12 DIAGNOSIS — H547 Unspecified visual loss: Secondary | ICD-10-CM

## 2022-03-12 DIAGNOSIS — I10 Essential (primary) hypertension: Secondary | ICD-10-CM

## 2022-03-12 DIAGNOSIS — H269 Unspecified cataract: Secondary | ICD-10-CM

## 2022-03-12 DIAGNOSIS — G259 Extrapyramidal and movement disorder, unspecified: Secondary | ICD-10-CM

## 2022-03-12 NOTE — Patient Instructions
It was nice to see you today.  Thank you for choosing to visit our clinic.  Your time is important, and if you had to wait today, we do apologize.  Our goal is to run exactly on time.  However, on occasion, we get behind in clinic due to unexpected patient issues.  Thank you for your patience.     General Instructions:  Scheduling:  Our scheduling phone number is [phone] option #1.   How to reach our office:  Please send a MyChart message to Neurosurgery or leave a voicemail for the nurse, Elane Fritz [phone].  How to get a medication refill:  Please use the MyChart Refill request or contact your pharmacy directly to request medication refills.  Please allow 72 business hours for request to be completed.    Support for many chronic illnesses is available through [employer] at [email or web address] or [phone].    For help with MyChart:  please call [phone].    For questions on nights, weekends or holidays:  call the Operator at [phone], and ask for the doctor on call for Neurosurgery.    For more information on spinal conditions:  please visit [email or web address]   Our office fax number is [phone]     Again, thank you for coming in today.        Lonzo Candy, Adelfa Koh  FUS Coordinator  Clinical Nurse Coordinator for:  Dr. Lorel Monaco   Department of Neurosurgery/[place]  Ph: [phone]   Fax: [phone]      For up to date information on the COVID-19 virus, visit the [facility] website. [email or web address]   General supportive care during cold and flu season and infection prevention reminders:    o Wash hands often with soap and water for at least 20 seconds   o Cover your mouth and nose   o Social distancing: try to maintain 6 feet between you and other people   o Stay home if sick and symptoms mild or manageable?  If you must be around people wear a mask     If you are having symptoms of a lower respiratory infection (cough, shortness of breath) and/or fever AND either traveled in last 30 days (internationally or to region of exposure) OR known exposure to patient with COVID19:     o Call your primary care provider for questions or health needs.   Tell your doctor about your recent travel and your symptoms     o In a medical emergency, call 911 or go to the nearest emergency room.    Falls Can Be Prevented  Elderly Trauma Patients  Talk to Your Doctor  Talk to your healthcare provider to assess your risk for falling. Ask them how you can prevent falling.  Ask your healthcare provider or pharmacist to review your medicines.  Some medicine combinations might make you dizzy or sleepy. This should include prescription medicines and over-the-counter medicines.  Ask your healthcare provider about taking vitamin D supplements.     Do Strength and Balance Exercises  Do exercises that make your legs stronger and improve your balance. Tai Chi is a good example of this kind of exercise.     Have Your Eyes Checked  Have your eyes checked by an eye doctor once a year. Update your eyeglasses if needed.  Bifocals can make things seem closer or farther away than they really are. This can affect your balance and make you more likely to fall. If you use bifocals, have a pair of glasses without  bifocals for outdoor activities, such as walking.     Make Your Home Safer  Remove things you could trip over.  Add grab bars inside and outside your tub or shower and next to the toilet.  Put railings on both sides of stairs.  Make sure your home has lots of light by adding more or brighter light bulbs.        Washing Your Hands  Step-by-Step    StayWell last reviewed this educational content on 01/08/2021  ? 2000-2023 [employer], [place]. All rights reserved. This information is not intended as a substitute for professional medical care. Always follow your healthcare professional's instructions.

## 2022-03-13 DIAGNOSIS — G20A1 Parkinson's disease without dyskinesia, without mention of fluctuations: Secondary | ICD-10-CM

## 2022-03-19 NOTE — Patient Instructions
Thank you for choosing to let us care for you today.  Please read through the following information that will help us continue to provide the best care possible.     -- Preferred method of communication is through MyChart message, if the issue cannot wait until your next scheduled follow up.   -- MyChart may be used for non-emergent communication. Emails are not reviewed after hours or over the weekend/holidays/after 4PM. Staff will reply to your email within 24-48 business hours.       -- If you do not hear from us within one week of a lab or imaging study being completed, please call/send my chart email to the office to be sure that we have received the results. This is especially challenging when tests are done outside of the [place] system, as many times results do not make it back to our office for a variety of reasons. In our office no news is good news does not apply. You should hear from us with results for each test.  [place] lab/imaging results:  Due to the CARES act, results automatically release to MyChart.  Dr. Au will continue to send you a result note on any labs that he orders.  With these changes you may see your results before Dr. Au does.   Please allow up to 72 hours for review and response to your results.     -- If you are having acute (new/sudden onset) or severe/worsening neurologic symptoms, please call 911 or seek care in ED.    -- For scheduling of IMAGING/RADIOLOGY, please call [phone] at your convenience to schedule your studies.  -- For referrals placed during the visit, if you have not heard from scheduling within one week, please call the call center at [phone] to get scheduling assistance.  -- For refills on medications, please first contact your pharmacy, who will fax a refill authorization request form to our office.  Weekdays only. Allow up to 2 business days for refills. Please plan ahead, as refills will not be filled after hours.    -- Our scheduling staff may be reached at [phone] opt. 1 for scheduling needs.     --Hoy Fallert, RN may be contacted at [phone] for urgent needs. Staff will return your call within 24 business hours.     For Appointments:   -- Please try to arrive early for your appointment time to help facilitate your visit. 15 minutes early is recommended.   -- If you are late to your appointment, we reserve the right to ask you to reschedule or wait until next available time to be seen in fairness to other patients scheduled that day.   -- There are times when we are running behind in clinic. Our goal is to always be on time, however, there are time when unexpected events occur with patients, which may cause a delay. We appreciate your understanding when this occurs.

## 2022-03-22 ENCOUNTER — Encounter: Admit: 2022-03-22 | Discharge: 2022-03-22 | Payer: MEDICARE

## 2022-03-22 ENCOUNTER — Ambulatory Visit: Admit: 2022-03-22 | Discharge: 2022-03-22 | Payer: MEDICARE

## 2022-03-22 DIAGNOSIS — R259 Unspecified abnormal involuntary movements: Secondary | ICD-10-CM

## 2022-03-22 DIAGNOSIS — I10 Essential (primary) hypertension: Secondary | ICD-10-CM

## 2022-03-22 DIAGNOSIS — G259 Extrapyramidal and movement disorder, unspecified: Secondary | ICD-10-CM

## 2022-03-22 DIAGNOSIS — R4189 Other symptoms and signs involving cognitive functions and awareness: Secondary | ICD-10-CM

## 2022-03-22 DIAGNOSIS — H269 Unspecified cataract: Secondary | ICD-10-CM

## 2022-03-22 DIAGNOSIS — M1990 Unspecified osteoarthritis, unspecified site: Secondary | ICD-10-CM

## 2022-03-22 DIAGNOSIS — G20A1 Parkinson's disease without dyskinesia or fluctuating manifestations: Secondary | ICD-10-CM

## 2022-03-22 DIAGNOSIS — H547 Unspecified visual loss: Secondary | ICD-10-CM

## 2022-05-07 ENCOUNTER — Encounter: Admit: 2022-05-07 | Discharge: 2022-05-07 | Payer: MEDICARE

## 2022-05-07 MED ORDER — CELECOXIB 200 MG PO CAP
200 mg | ORAL_CAPSULE | Freq: Every day | ORAL | 3 refills
Start: 2022-05-07 — End: ?

## 2022-05-07 MED ORDER — BENZTROPINE 0.5 MG PO TAB
ORAL_TABLET | 3 refills
Start: 2022-05-07 — End: ?

## 2022-05-07 MED ORDER — CARBIDOPA-LEVODOPA 25-100 MG PO TAB
ORAL_TABLET | 11 refills
Start: 2022-05-07 — End: ?

## 2022-05-07 NOTE — Telephone Encounter
LOV 01/22/22  NOV Not scheduled  Saw Dr Cathie Hoops 01/20/2023 who said continue meds  Received call from [facility] mail order pharmacy stating Pt would like to transfer CD/LD, benztropine, and celexicob. (All Rx'd by Dr Evette Cristal). Pending to Dr Evette Cristal.

## 2022-07-27 ENCOUNTER — Encounter: Admit: 2022-07-27 | Discharge: 2022-07-27 | Payer: MEDICARE

## 2022-07-28 ENCOUNTER — Encounter: Admit: 2022-07-28 | Discharge: 2022-07-28 | Payer: MEDICARE

## 2022-07-28 NOTE — Telephone Encounter
Verified 2 patient identifiers    Patient called into clinic and left a message that he would like to proceed with scheduling his DBS surgery. This RN called patient and let him know that we will need to complete the neuropsych testing first before we decide if surgery would be a good option for patient. This RN also sent an email to Dr. Tama Headings and Dr. Genevieve Norlander. Patient was agreeable to this plan of care and had no further questions or concerns at this time.

## 2022-08-06 ENCOUNTER — Encounter: Admit: 2022-08-06 | Discharge: 2022-08-06 | Payer: MEDICARE

## 2022-08-06 ENCOUNTER — Ambulatory Visit: Admit: 2022-08-06 | Discharge: 2022-08-06 | Payer: MEDICARE

## 2022-08-06 DIAGNOSIS — R259 Unspecified abnormal involuntary movements: Secondary | ICD-10-CM

## 2022-08-06 DIAGNOSIS — G20A1 Parkinson's disease, unspecified whether dyskinesia present, unspecified whether manifestations fluctuate (HCC): Secondary | ICD-10-CM

## 2022-08-06 DIAGNOSIS — H269 Unspecified cataract: Secondary | ICD-10-CM

## 2022-08-06 DIAGNOSIS — M1990 Unspecified osteoarthritis, unspecified site: Secondary | ICD-10-CM

## 2022-08-06 DIAGNOSIS — R4189 Other symptoms and signs involving cognitive functions and awareness: Secondary | ICD-10-CM

## 2022-08-06 DIAGNOSIS — I10 Essential (primary) hypertension: Secondary | ICD-10-CM

## 2022-08-06 DIAGNOSIS — G259 Extrapyramidal and movement disorder, unspecified: Secondary | ICD-10-CM

## 2022-08-06 DIAGNOSIS — H547 Unspecified visual loss: Secondary | ICD-10-CM

## 2022-08-06 NOTE — Progress Notes
Neuropsychological Evaluation    Name: Mitchell Burnett          MRN: [id number]      DOB: 07/20/1950      AGE: 72 y.o.   Date of Service: 08/06/2022    Referring Provider: Barbaraann Share, MD    Reason for Referral: Mitchell Burnett is a 65 year old left-handed man with Parkinson's disease who was referred for neuropsychological evaluation as part of a comprehensive workup for potential deep brain stimulation surgery.    Chief Complaint: Cognitive Change    History: Mitchell Burnett reported that he experienced initial onset of left hand tremor (although other records suggest onset may have been right-sided) about 12 or 13 years ago and was eventually diagnosed with Parkinson's disease.  He has followed with Dr. Hetty Ely for management of Parkinson's disease and was referred to Dr. Genevieve Norlander more recently for evaluation of potential surgical treatment options.  He and his family described at least mild cognitive changes including increased difficulty focusing attention, prospective memory difficulty (i.e., forgetting what he intended to do), difficulty recalling details from conversations, increased tendency to misplace items, increased difficulty keeping track of his schedule, difficulty with planning/sequencing, and word retrieval problems.  In addition to tremor, which currently seems to affect both hands fairly equally by his report, he noted that gait/balance difficulty has been among the most bothersome symptoms of Parkinson's disease.  Other reported issues included decreased visual acuity, occasional diplopia, and lightheadedness with position changes.    Previous medical history is summarized in history section below.  Dr. Kerby Less 09/30/2021 note indicated that he saw Mitchell Burnett upon referral from Dr. Hetty Ely to evaluate treatment options.  Dr. Genevieve Norlander indicated that he appeared to be a good candidate for DBS surgery and he recommended proceeding with workup for this.  He underwent OFF/ON medication testing during his return visit on 03/22/2022 and Dr. Genevieve Norlander noted good medication response.  Cognitive screening was suggestive of impairment, with a score of 14/30 on the [facility] Cognitive Assessment.  Dr. Genevieve Norlander recommended proceeding with the scheduled neuropsychological evaluation and he noted that he would review this and follow-up regarding potential cognitive risk once this was completed.  During today's visit, Mitchell Burnett reported that he has been experiencing somewhat increased difficulty falling asleep recently, but that is usually able to get adequate sleep.  His wife described occasional dream enactment behavior and Mitchell Burnett reported having vivid dreams.  There was no reported history of problems with sleep breathing.  He described decreased appetite with gradual weight loss of about 25 pounds.    Mitchell Burnett reported no history of psychiatric or psychological treatments.  He described his current mood as euthymic and he denied thoughts of self-harm.  He reported no significant anxiety problems.  There was no indication of increased irritability, personality change, or impulse control problems.  He reported experiencing visual and/or auditory hallucinations occasionally upon awakening.  He reported no other history of psychotic symptoms.    Mitchell Burnett indicated that he was not aware of any problems with his early development.  He reported no history of significant academic difficulties and he indicated that he completed high school.  He also did extensive vocational training.  He worked as a Teaching laboratory technician prior to retiring in 2011.  He lives with his wife.    Mitchell Burnett's wife manages the household finances, which is not a recent change.  He has managed his medications independently in the past, but his wife has been providing some  assistance with medication management recently.  He limits his driving to familiar areas close to his home and he did not report noticing driving difficulty within those parameters.  He continues to complete yard work and household chores such as cleaning floors and taking out trash.  He is independent in grooming and dressing.  His hobbies have included hunting (although he last went hunting about 1.5 years ago) and golf.    Medical History:   Past Medical History:   Diagnosis Date    Abnormal involuntary movement     Arthritis     Cataract     Hypertension     Movement disorder     Parkinson disease (HCC)     Vision decreased      Surgical History:   Surgical History:   Procedure Laterality Date    SHOULDER SURGERY  1983    Right- dislocated    CERVICAL SPINE SURGERY  2009    SHOULDER REPLACEMENT Right 2012    LEFT COMPLEX TRIPLE ARTHRODESIS Left 12/04/2018    Performed by Ree Shay, [facility] at [facility] OR    LEFT FIRST TARSOMETATARSAL ARTHRODESIS Left 12/04/2018    Performed by Ree Shay, [facility] at [facility] OR    LEFT LATERAL ANLKE LIGAMENT Left 12/04/2018    Performed by Ree Shay, MD at [facility] OR    LEFT ACHILLES TENDON LENGTHENING  Left 12/04/2018    Performed by Ree Shay, MD at [facility] OR    LEFT  FIRST METATARSOPHALANGEAL JOINT RTS IMPLANT Left 12/04/2018    Performed by Ree Shay, [facility] at [facility] OR    CENTRALIZATION TIBIALIS ANTERIOR TENDON Left 12/04/2018    Performed by Ree Shay, MD at [facility] OR    ARTHRODESIS LEFT GREAT TOE - INTERPHALANGEAL JOINT Left 05/14/2019    Performed by Rayford Halsted, MD at [facility] OR    LEFT OPEN FLEXOR HALLUCIS TENOTOMY Left 05/14/2019    Performed by Rayford Halsted, MD at [facility] OR    CATARACT REMOVAL Bilateral 01/2020    PANCREAS SURGERY  02/11/2022    COLONOSCOPY       Family History:   Family History   Problem Relation Name Age of Onset    Cancer Mother      Dementia Sister      Cancer Brother      Diabetes Neg Hx      Gout Neg Hx      SLE Neg Hx      Stroke Neg Hx      Psoriasis Neg Hx      Heart Disease Neg Hx      Kidney Disease Neg Hx      Parkinson's  Neg Hx      Tremor Neg Hx       Social/Developmental History:   Social History     Socioeconomic History Marital status: Married   Tobacco Use    Smoking status: Never    Smokeless tobacco: Never   Substance     Alcohol use: Yes     Alcohol/week: 0.0 standard drinks of alcohol     Comment: 2-3 drinks per week    Drug use: No         Relevant Studies:   Lab Results   Component Value Date/Time    TSH 2.350 01/08/2003 04:17 AM         Medications:   allopurinoL (ZYLOPRIM) 100 mg tablet Take one tablet by mouth daily. Take with food.    Web designer  300 mg tab Take one tablet by mouth daily.    benztropine (COGENTIN) 0.5 mg tablet TAKE 1 TABLET BY MOUTH TWICE DAILY FOR  TREMOR    carbidopa-levodopa (SINEMET) 25-100 mg tablet One and 1/2  pill three times a day. 7 am, 11 am and between 4 and 5 pm    celecoxib (CELEBREX) 200 mg capsule Take one capsule by mouth daily.    cholecalciferol (VITAMIN D-3) 1,000 units tablet Take one tablet by mouth daily.    docusate (COLACE) 100 mg capsule Take one capsule by mouth twice daily as needed for Constipation.    doxazosin (CARDURA) 8 mg tablet Take one tablet by mouth at bedtime daily.    hydroCHLOROthiazide (HYDRODIURIL) 25 mg tablet Take one tablet by mouth every morning.    oxyCODONE (ROXICODONE) 5 mg tablet Take one tablet to two tablets by mouth every 4 hours as needed for Pain    rosuvastatin (CRESTOR) 10 mg tablet Take one tablet by mouth at bedtime daily.    sildenafil(+) (VIAGRA) 50 mg tablet Take 1 Tab by mouth as Needed for Erectile dysfunction.        Neurobehavioral Status Examination: Mr. Loeber was accompanied by his wife and son, who were present during the clinical interview.  He was casually dressed and well-groomed.  His gait was relatively slow, with short steps and mildly stooped posture.  He displayed bilateral hand tremor.  Conversational speech was normal, with the exception of a few brief word finding pauses.  Comprehension seemed intact during the interview.  Thoughts were coherent and logical.  He was able to provide a reasonably thorough history, although his wife gave additional details on occasion.  He did not report subjective mood disturbance and his affect varied appropriately.  During testing, he seemed to comprehend instructions adequately.  Response speed was relatively slow.  He was polite and cooperative.    Test Procedures:  For a listing of all tests administered, please see the appended table. Note that test results should be interpreted by a neuropsychologist and that individual test scores should be interpreted only in the context of other findings and history.     Test Results: No formal performance validity measures were administered during this clinically referred evaluation; however, a validity indicator derived from one task was in the acceptable range and test scores were considered an accurate indication of Mr. Kaeser's abilities.  His overall performance on a general cognitive screening measure (DRS-2) was within the normal range when compared to a normative sample of older African-American adults ([employer] Older African American Normative Studies).  An estimate of previous intellectual functioning was within the average range and this estimate was included along with demographic variables in comparing test scores to norms when possible (i.e., measures included in the CNNS test battery were calibrated for all possible variables including HART score).  Compared to normative expectations, he displayed intact immediate auditory attention, with deficits of varying severity across more complex attention tasks.  He displayed a deficit on a problem-solving task used to assess aspects of executive functioning (M-WCST).  He displayed grossly intact initial learning of a word list, with no information recalled during the delayed free recall trial, but clear improvement with recognition cues.  Immediate and delayed recall of short stories was intact.  Among language tasks, he displayed a deficit in semantic verbal fluency (with stronger performance on a semantic fluency task given during the DRS-2), but confrontation naming was intact.  Performance on a visuospatial judgment task was  intact.  He endorsed minimal symptoms on a depression inventory.  He reported mildly elevated daytime sleepiness.    Impression: Neuropsychological findings were significant for evidence of deficits in attention, executive functioning, and more variably in memory retrieval.  Mr. Haselhuhn displayed intact abilities in other areas.  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  There was no indication of significant mood disturbance.    Recommendations:  1) Mr. Birdsell was encouraged to follow up with Dr. Genevieve Norlander to discuss the neuropsychological findings in the context of the broader medical workup.  2) cognitive impairment was relatively mild and there was no indication of deficits of a severity that would limit his ability to make informed decisions or take an active role in his healthcare.  3) initial feedback was provided to Mr. Lemburg with his wife and son present at the conclusion of the evaluation.  If there are further questions regarding the above findings, we can be reached at [phone].      Hoyle Barr, PhD, ABPP/CN     Time Documentation: Total time for this evaluation included 132 minutes of neuropsychological test administration and scoring by technician, two or more tests, any method; 38 minutes of neuropsychologist time in interview/neurobehavioral status examination, and 104 minutes of neuropsychological testing evaluation services by neuropsychologist including integration patient data, interpretation of standardized test results and clinical data, clinical decision-making, treatment planning and report and interactive feedback to the patient, family member(s) or caregiver(s).    Test Results Table:     Estimated Premorbid Ability: Raw Score Std Score Percentile     Hopkins Adult Reading Test 13 95 37     HART/Demographic Estimated IQ -- 104 61         Dementia Screen:        Dementia Rating Scale-2  Scaled Score        Attention 35 11 63       Initiation/Perseveration 34 8 25       Construction 6 12 75       Conceptualization 30 9 37       Memory 19 6 9        Total Score 124 8 25         Attention:  T Score      Digit Span-Longest Forward Span 5 43 24     Digit Span-Longest Backward Span 3 35 7     Digit Span Total 8 36 8     T Score      Brief Test of Attention-Numbers 3 34 5     Brief Test of Attention-Letters 0 35 7     Brief Test of Attention Total 3 36 8         Verbal Memory:        Hopkins Verbal Learning Test-Revised  T Score        Total Recall (Trials 1-3) 14 40 16       Delayed Recall 0 29 2       Percent Retention 0 29 2       Recognition Discrimination Index 9 42 21     T Score*      Logical Memory I (WMS-IV) 32 59 82     Logical Memory II (WMS-IV) 13 52 58   *WMS-IV Logical Memory T scores = ACS demographically adjusted  Cum. %      Logical Memory Recognition (WMS-IV) 21 >75          Language:  T Score      Boston Naming Test-30 item version 29 58 79           Calibrated Ideational Fluency  T Score        Letter Word Fluency 14 40 16       Animal Naming Fluency 4 21 <1         Executive Function:        Modified [employer]  T Score        Categories Correct 2 30 2        Perseverative Errors 19 28 1        Total Errors 36 25 1       Percent Perseverative Errors 53 39 14         Perceptual Function:  Scaled Score      Judgment of Line Orientation          19 11 63         Emotional Function:   Descriptor     GDS-15 1  Normal         Other Measures:   Descriptor     Epworth Sleepiness Scale 12  Mildly Elevated     Reliable Digit Span   Valid     Notes:   1) scores provided for professional use, for interpretation, see test results/impression sections; 2) Calibrated Neuropsychological Normative System (Schretlen, Cumming, & [place], 2010) scores were calibrated for age, sex, race, education, & HART score unless otherwise noted - scores from non-CNNS tests are not necessarily calibrated for the same variables; 3) score descriptors in the table below are for scores with normal distributions taken from [employer] on Uniform Labeling of Performance Test Scores Marshall & Ilsley et al., 2020)    T Score Percentile Descriptor   ?70 ?98 Exceptionally High   64-69 91-97 Above Average   57-63 75-90 High Average   44-56 25-74 Average   37-43 9-24 Low Average   30-36 2-8 Below Average   <30 <2 Exceptionally Low

## 2022-08-06 NOTE — Progress Notes
Total psychometrist face-to-face (and scoring) time with patient was 132 minutes.

## 2022-08-17 ENCOUNTER — Encounter: Admit: 2022-08-17 | Discharge: 2022-08-17 | Payer: MEDICARE

## 2022-08-30 ENCOUNTER — Encounter: Admit: 2022-08-30 | Discharge: 2022-08-30 | Payer: MEDICARE

## 2022-08-30 MED ORDER — DONEPEZIL 5 MG PO TAB
5 mg | ORAL_TABLET | Freq: Every day | ORAL | 5 refills | 90.00000 days | Status: AC
Start: 2022-08-30 — End: ?

## 2022-08-30 NOTE — Telephone Encounter
Called Mitchell Burnett and spoke with him and his wife, [name]. Relayed Dr. Kerby Less message from 08/17/22. They verbalized understanding. He's agreeable to trying donepezil. Gave direct line if he had any side effects. He stated he wants to try the donepezil first, then see what he thinks about DBS.     Received refill request for donepezil 5 mg daily.  Medication is listed to continue under plan of care from last visit.  Patient has been seen in the last year and has a future appointment scheduled.  Refills pended to neurologist to co-sign.

## 2022-08-30 NOTE — Telephone Encounter
Message  Received: 4 days ago  Au, Genevie Ann, MD  Lonzo Candy, BSN; Rudene Christians, PhD; Deidre Ala, RN; Lorel Monaco Nolon Bussing, MD; Georgia Duff  Thank you Gloris Manchester    Maralyn Sago can you follow-up with the patient and the message I sent to him on 08/17/22 about the Neuropsychology testing results? It does not appear he's read this to date. Can you relay to him the message and let me know what questions we have so we can set up a time to discuss this further.    Ka Loong Catalina Lunger, MD, FRCPC  [facility]  [facility]          Previous Messages       ----- Message -----  From: Lonzo Candy, BSN  Sent: 08/26/2022   1:49 PM CDT  To: Rudene Christians, PhD; Barbaraann Share, MD; *    Dr. Genevieve Norlander,    I looked at Mr. Larner's recent neuropsych eval and looks like he needs to speak with you prior to proceeding with surgery.  The neuropsych testing was done on 08/06/22. Please let me know how to proceed with this patient.    Thank you,  Lonzo Candy, [facility]  FUS Coordinator  Clinical Nurse Coordinator for:  Dr. Lorel Monaco and Dr. Sena Hitch  Department of Neurosurgery  [facility]  Ph: [phone]  Fax: [phone]

## 2022-10-04 ENCOUNTER — Ambulatory Visit: Admit: 2022-10-04 | Discharge: 2022-10-05 | Payer: MEDICARE

## 2022-10-04 ENCOUNTER — Encounter: Admit: 2022-10-04 | Discharge: 2022-10-04 | Payer: MEDICARE

## 2022-10-04 DIAGNOSIS — G20A1 Parkinson's disease, unspecified whether dyskinesia present, unspecified whether manifestations fluctuate (HCC): Secondary | ICD-10-CM

## 2022-10-04 DIAGNOSIS — H269 Unspecified cataract: Secondary | ICD-10-CM

## 2022-10-04 DIAGNOSIS — R259 Unspecified abnormal involuntary movements: Secondary | ICD-10-CM

## 2022-10-04 DIAGNOSIS — I10 Essential (primary) hypertension: Secondary | ICD-10-CM

## 2022-10-04 DIAGNOSIS — M1990 Unspecified osteoarthritis, unspecified site: Secondary | ICD-10-CM

## 2022-10-04 DIAGNOSIS — G259 Extrapyramidal and movement disorder, unspecified: Secondary | ICD-10-CM

## 2022-10-04 DIAGNOSIS — H547 Unspecified visual loss: Secondary | ICD-10-CM

## 2022-10-04 NOTE — Progress Notes
HPI:  I had the pleasure of seeing Mitchell Burnett in the office today.    Mitchell Burnett, a 73 year old individual with Parkinson's disease, presents for a discussion regarding deep brain stimulation (DBS) as a potential treatment option. 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    The patient is particularly interested in the surgical procedure, expressing curiosity about the invasiveness, the number of holes drilled, and the placement of the electrode. He also inquires about the frequency of such procedures performed by the medical team.    The patient has been prescribed medication for memory issues, which he reports has negatively impacted his sleep. He expresses concern about the potential worsening of his memory and cognitive function over time.    The patient's primary goal for considering DBS is to improve his balance, which he perceives as being linked to his tremors. He expresses frustration that his balance issues will not be directly addressed by the DBS procedure.    The patient is active and engaged in various activities, including painting his house and installing electrical outlets. He expresses concern about how the DBS procedure might impact his ability to continue these activities.    The patient has a pacemaker and defibrillator and is familiar with the concept of a rechargeable system. He expresses interest in the rechargeable option for the DBS system, asking about the frequency of recharging and the potential impact of household electrical currents on the system.    The patient is aware of the potential risks and benefits of the DBS procedure, including the possibility of memory impairment. He expresses readiness to proceed with the procedure, understanding that it may not directly improve his balance but could potentially improve his tremors and overall quality of life.              Exam:  AA  Conversant  Speech fluent  MAEW with good strength  CN II-XII GI  Sensation GI  Bilateral resting tremor  Some gait difficulty and challenges when he first gets up from a seated position              Plan:      Parkinson's Disease  Discussed deep brain stimulation (DBS) as a treatment option. The patient has undergone all appropriate testing, including neuropsych testing, which showed mild cognitive impairment. The planned target is the subthalamic nucleus using the Medtronic system.  -Schedule MRI and surgery for bilateral STN DBS electrode placement using the Medtronic system.  -Confirm with Dr. Genevieve Norlander regarding the appropriateness of the target in light of the patient's neuropsychiatric evaluation.    Mild Cognitive Impairment  The patient reported sleep disturbances with the current medication. Discussed the importance of sleep for memory function and overall health.  -Recommend the patient to discuss with Dr. Mindi Junker about the sleep disturbances caused by the current medication and consider discontinuation if it continues to impact sleep.    Gait and Balance Issues  Discussed the limitations of DBS in improving gait and balance. Emphasized the importance of therapy for improving these symptoms.  -Encourage the patient to continue with therapy for gait and balance improvement.    Post-Operative Care  Discussed the post-operative care and restrictions after DBS surgery.  -Advise the patient to avoid heavy lifting and submerging the incision for about six weeks post-surgery.  -Encourage the patient to be active after the initial few weeks of recovery.  I spent 30 minutes in face to face contact. Over 50% of that time was spent in education and counseling. At the conclusion of our discussion she indicated that there were no further questions at this time.    ATTESTATION  This visit was documented by [employer] via audio recorded by Gwyndolyn Kaufman, MD, on 10/04/2022. If you note mistakes or inaccurate information please feel free to reach out to ensure accuracy.    Gwyndolyn Kaufman, MD  Assistant Professor, Department of Neurosurgery  [facility]

## 2022-10-16 ENCOUNTER — Encounter: Admit: 2022-10-16 | Discharge: 2022-10-16 | Payer: MEDICARE

## 2022-10-21 ENCOUNTER — Encounter: Admit: 2022-10-21 | Discharge: 2022-10-21 | Payer: MEDICARE

## 2022-10-21 DIAGNOSIS — G20A2 Parkinson's disease with fluctuating manifestations, unspecified whether dyskinesia present (HCC): Secondary | ICD-10-CM

## 2022-10-21 DIAGNOSIS — Z8669 Personal history of other diseases of the nervous system and sense organs: Secondary | ICD-10-CM

## 2022-10-21 DIAGNOSIS — Z9689 Presence of other specified functional implants: Secondary | ICD-10-CM

## 2022-10-27 ENCOUNTER — Encounter: Admit: 2022-10-27 | Discharge: 2022-10-27 | Payer: MEDICARE

## 2022-10-27 NOTE — Telephone Encounter
Pt called and LVM that he's taking donepezil 5 mg and is having more problems with sleeping with this medicine. He asked if there was something else we can do or try. Stated he took it for 30 days and is ready for a refill.     Called pt and he confirmed he is taking donepezil 5 mg daily in the morning. Stated it's affecting his sleep, which started a week after he started the medication. Stated he wakes up at 2:15 am every night. Stated he feels like the donepezil is not doing anything. Then stated he stopped taking the donepezil last Monday 10/18/22. Stated his sleep is starting to get better. Stated he didn't wake up the last two nights.

## 2022-11-04 ENCOUNTER — Encounter: Admit: 2022-11-04 | Discharge: 2022-11-04 | Payer: MEDICARE

## 2022-12-01 ENCOUNTER — Encounter: Admit: 2022-12-01 | Discharge: 2022-12-01 | Payer: MEDICARE

## 2022-12-01 DIAGNOSIS — M1990 Unspecified osteoarthritis, unspecified site: Secondary | ICD-10-CM

## 2022-12-01 DIAGNOSIS — R259 Unspecified abnormal involuntary movements: Secondary | ICD-10-CM

## 2022-12-01 DIAGNOSIS — N400 Enlarged prostate without lower urinary tract symptoms: Secondary | ICD-10-CM

## 2022-12-01 DIAGNOSIS — H269 Unspecified cataract: Secondary | ICD-10-CM

## 2022-12-01 DIAGNOSIS — G259 Extrapyramidal and movement disorder, unspecified: Secondary | ICD-10-CM

## 2022-12-01 DIAGNOSIS — H547 Unspecified visual loss: Secondary | ICD-10-CM

## 2022-12-01 DIAGNOSIS — I10 Essential (primary) hypertension: Secondary | ICD-10-CM

## 2022-12-01 DIAGNOSIS — E785 Hyperlipidemia, unspecified: Secondary | ICD-10-CM

## 2022-12-01 DIAGNOSIS — Z950 Presence of cardiac pacemaker: Secondary | ICD-10-CM

## 2022-12-01 DIAGNOSIS — G20A1 Parkinson disease (HCC): Secondary | ICD-10-CM

## 2022-12-09 ENCOUNTER — Encounter: Admit: 2022-12-09 | Discharge: 2022-12-09 | Payer: MEDICARE

## 2022-12-29 ENCOUNTER — Encounter: Admit: 2022-12-29 | Discharge: 2022-12-29 | Payer: MEDICARE

## 2023-01-13 ENCOUNTER — Encounter: Admit: 2023-01-13 | Discharge: 2023-01-13 | Payer: MEDICARE

## 2023-01-13 NOTE — Telephone Encounter
Pt called and LVM that he has some questions. Stated he has the MRI done in December and he has some questions about that.     Called pt and he stated he wanted to know details about the surgery and restrictions. Gave him neurosurgery's number. Gave him my number if there were other questions he needed in the future that were for Dr. Genevieve Norlander.

## 2023-01-21 ENCOUNTER — Encounter: Admit: 2023-01-21 | Discharge: 2023-01-21 | Payer: MEDICARE

## 2023-01-21 NOTE — Telephone Encounter
Pt called and LVM that they plan on doing some surgery on him next week in his hairline and he just wanted to know what to do.     Called pt and he stated he has some psoriasis and wanted to know if this will affect anything. Encouraged him to call neurosurgery as they could better answer this question. He confirmed he has their number.

## 2023-01-26 ENCOUNTER — Encounter: Admit: 2023-01-26 | Discharge: 2023-01-26 | Payer: MEDICARE

## 2023-01-26 NOTE — Telephone Encounter
Pt's wife, [name], called and LVM that pt has an MRI on 01/28/23 at 7 am. She asked why time they need to get there and to get the address.     Genia Del and relayed the radiology phone number and encouraged her to call them for answers. She verbalized understanding.

## 2023-01-28 ENCOUNTER — Encounter: Admit: 2023-01-28 | Discharge: 2023-01-28 | Payer: MEDICARE

## 2023-01-28 ENCOUNTER — Ambulatory Visit: Admit: 2023-01-28 | Discharge: 2023-01-28 | Payer: MEDICARE

## 2023-01-28 ENCOUNTER — Ambulatory Visit: Admit: 2023-01-28 | Discharge: 2023-01-29 | Payer: MEDICARE

## 2023-01-28 MED ORDER — ARTIFICIAL TEARS (PF) SINGLE DOSE DROPS GROUP
OPHTHALMIC | 0 refills | Status: DC
Start: 2023-01-28 — End: 2023-01-28

## 2023-01-28 MED ORDER — DEXAMETHASONE SODIUM PHOSPHATE 4 MG/ML IJ SOLN
INTRAVENOUS | 0 refills | Status: DC
Start: 2023-01-28 — End: 2023-01-28

## 2023-01-28 MED ORDER — ROCURONIUM 10 MG/ML IV SOLN
INTRAVENOUS | 0 refills | Status: DC
Start: 2023-01-28 — End: 2023-01-28

## 2023-01-28 MED ORDER — PROPOFOL INJ 10 MG/ML IV VIAL
INTRAVENOUS | 0 refills | Status: DC
Start: 2023-01-28 — End: 2023-01-28

## 2023-01-28 MED ORDER — SUGAMMADEX 100 MG/ML IV SOLN
INTRAVENOUS | 0 refills | Status: DC
Start: 2023-01-28 — End: 2023-01-28

## 2023-01-28 MED ORDER — FENTANYL CITRATE (PF) 50 MCG/ML IJ SOLN
INTRAVENOUS | 0 refills | Status: DC
Start: 2023-01-28 — End: 2023-01-28

## 2023-01-28 MED ORDER — SODIUM CHLORIDE 0.9 % IJ SOLN
INTRAVENOUS | 0 refills | Status: DC
Start: 2023-01-28 — End: 2023-01-28

## 2023-01-28 MED ORDER — ONDANSETRON HCL (PF) 4 MG/2 ML IJ SOLN
INTRAVENOUS | 0 refills | Status: DC
Start: 2023-01-28 — End: 2023-01-28

## 2023-01-28 MED ORDER — LIDOCAINE (PF) 200 MG/10 ML (2 %) IJ SYRG
INTRAVENOUS | 0 refills | Status: DC
Start: 2023-01-28 — End: 2023-01-28

## 2023-01-28 MED ORDER — PHENYLEPHRINE HCL IN 0.9% NACL 1 MG/10 ML (100 MCG/ML) IV SYRG
INTRAVENOUS | 0 refills | Status: DC
Start: 2023-01-28 — End: 2023-01-28

## 2023-01-28 MED ORDER — GLYCOPYRROLATE 0.2 MG/ML IJ SOLN
INTRAVENOUS | 0 refills | Status: DC
Start: 2023-01-28 — End: 2023-01-28

## 2023-01-28 MED ADMIN — GADOBENATE DIMEGLUMINE 529 MG/ML (0.1MMOL/0.2ML) IV SOLN [135881]: 14 mL | INTRAVENOUS | @ 21:00:00 | Stop: 2023-01-28 | NDC 00270516414

## 2023-01-28 NOTE — Anesthesia Procedure Notes
Procedure: Airway Placement    AIRWAY INSERTION    Date/Time: 01/28/2023 2:05 PM    Patient location: OR  Urgency: elective  Difficult Airway: No            Airway Procedure  Indication(s) for airway management: surgery        no    Preoxygenated: yes  Patient position: sniffing  Neck stabilization: no in-line stabilization    Mask difficulty assessment: 1 - vent by mask      Procedure Outcome  Final airway type: endotracheal airway  Endotracheal airway: ETT          ETT size (mm): 7.5  Technique used for successful ETT placement: direct laryngoscopy  Devices/methods used in placement: intubating stylet  Insertion site: oral  Blade type: Macintosh   Laryngoscope/Videolaryngoscope blade size: 3  Cormack-Lehane classification: grade I - full view of glottis      Measured from: teeth   Depth: 22 cm  Amount of Air in Cuff: 7 ml  Number of attempts at approach: 1  Placement verified by auscultation and capnometry            Complications  Cardiovascular:   Pulmonary:   Procedure: airway not difficult  Medication:   Additional notes: Atraumatic, oral mucosa and dentition unchanged.      Performed by: Onnie Boer, MD  Authorized by: Onnie Boer, MD

## 2023-01-28 NOTE — Anesthesia Post-Procedure Evaluation
Post-Anesthesia Evaluation    Name: Mitchell Burnett      MRN: [id number]     DOB: 10/21/1950     Age: 72 y.o.     Sex: male   __________________________________________________________________________     Procedure Information       Anesthesia Start Date/Time: 01/28/23 1358    Procedure: MRI HEAD WO/W CONTRAST    Location: Imaging, MRI: [facility]            Post-Anesthesia Vitals  BP: 190/95 (12/20 1556)  Temp: 36.1 ?C (97 ?F) (12/20 1556)  Pulse: 77 (12/20 1556)  Respirations: 17 PER MINUTE (12/20 1556)  SpO2: 97 % (12/20 1556)  O2 Device: None (Room air) (12/20 1556)   Vitals Value Taken Time   BP 180/105 01/28/23 1545   Temp 35.9 ?C (96.7 ?F) 01/28/23 1518   Pulse 76 01/28/23 1545   Respirations 16 PER MINUTE 01/28/23 1545   SpO2 97 % 01/28/23 1545   O2 Device None (Room air) 01/28/23 1530   ABP     ART BP           Post Anesthesia Evaluation Note    Evaluation location: pre/post  Patient participation: recovered; patient participated in evaluation  Level of consciousness: alert  Pain management: adequate    Hydration: normovolemia  Temperature: 36.0?C - 38.4?C  Airway patency: adequate    Perioperative Events       Post-op nausea and vomiting: no PONV    Postoperative Status  Cardiovascular status: hemodynamically stable and hypertensive  Respiratory status: spontaneous ventilation        Perioperative Events  There were no known complications for this encounter.

## 2023-02-10 ENCOUNTER — Inpatient Hospital Stay: Admit: 2023-02-10 | Discharge: 2023-02-10 | Payer: MEDICARE

## 2023-02-10 ENCOUNTER — Encounter: Admit: 2023-02-10 | Discharge: 2023-02-10 | Payer: MEDICARE

## 2023-02-10 ENCOUNTER — Ambulatory Visit: Admit: 2023-02-10 | Discharge: 2023-02-11 | Payer: MEDICARE

## 2023-02-21 ENCOUNTER — Encounter: Admit: 2023-02-21 | Discharge: 2023-02-21 | Payer: MEDICARE

## 2023-02-21 DIAGNOSIS — G20A1 Parkinson's disease, unspecified whether dyskinesia present, unspecified whether manifestations fluctuate (HCC): Secondary | ICD-10-CM

## 2023-02-21 NOTE — Progress Notes
Neurosurgery Clinic Note    Clinic Date: 02/21/2023 10:47 AM      Reason for visit: Parkinson's disease planning DBS electrode placement       History of Present Illness:   Mitchell Burnett is a 73 y.o. male. The patient has a hx of parkinson's disease. He is multiple medications and as the disease progresses has elected for deep brain stimulation.       Vitals:  There were no vitals filed for this visit.    Allergies:  Allergies   Allergen Reactions    Pcn [Penicillins] UNKNOWN     Pt received cefazolin 11/2018 and 11/2012       Medications:    Current Outpatient Medications:     allopurinoL (ZYLOPRIM) 100 mg tablet, Take one tablet by mouth at bedtime daily. Take with food., Disp: , Rfl:     Apple Cider Vinegar 300 mg tab, Take one tablet by mouth daily as needed., Disp: , Rfl:     benztropine (COGENTIN) 0.5 mg tablet, TAKE 1 TABLET BY MOUTH TWICE DAILY FOR  TREMOR, Disp: 180 tablet, Rfl: 3    calcium carbonate (TUMS) 500 mg (200 mg elemental calcium) chewable tablet, Chew one tablet by mouth daily as needed., Disp: , Rfl:     carbidopa-levodopa (SINEMET) 25-100 mg tablet, One and 1/2  pill three times a day. 7 am, 11 am and between 4 and 5 pm, Disp: 405 tablet, Rfl: 11    celecoxib (CELEBREX) 200 mg capsule, Take one capsule by mouth daily., Disp: 90 capsule, Rfl: 3    rosuvastatin (CRESTOR) 10 mg tablet, Take one tablet by mouth at bedtime daily., Disp: , Rfl:     Past Medical History  Past Medical History:    Abnormal involuntary movement    Arthritis    BPH (benign prostatic hyperplasia)    Cataract    HLD (hyperlipidemia)    Hypertension    Movement disorder    Parkinson disease (HCC)    Vision decreased       Surgical History  Surgical History:   Procedure Laterality Date    SHOULDER SURGERY  1983    Right- dislocated    CERVICAL SPINE SURGERY  2009    SHOULDER REPLACEMENT Right 2012    LEFT COMPLEX TRIPLE ARTHRODESIS Left 12/04/2018    Performed by Ree Shay, MD at [facility] OR    LEFT FIRST TARSOMETATARSAL ARTHRODESIS Left 12/04/2018    Performed by Ree [facility]ay, [facility] at [facility] OR    LEFT LATERAL ANLKE LIGAMENT Left 12/04/2018    Performed by Ree Shay, MD at [facility] OR    LEFT ACHILLES TENDON LENGTHENING  Left 12/04/2018    Performed by Ree Shay, MD at [facility] OR    LEFT  FIRST METATARSOPHALANGEAL JOINT RTS IMPLANT Left 12/04/2018    Performed by [facility]e [facility]ay, [facility] at [facility] OR    CENTRALIZATION TIBIALIS ANTERIOR TENDON Left 12/04/2018    Performed by Ree Shay, MD at [facility] OR    ARTHRODESIS LEFT GREAT TOE - INTERPHALANGEAL JOINT Left 05/14/2019    Performed by Rayford Halsted, MD at [facility] OR    LEFT OPEN FLEXOR HALLUCIS TENOTOMY Left 05/14/2019    Performed by Rayford Halsted, MD at [facility] OR    CATARACT REMOVAL Bilateral 01/2020    COLONOSCOPY      HX TURP         Social History  Social History     Tobacco Use    Smoking status: Never  Smokeless tobacco: Never   Vaping Use    Vaping status: Never Used   Substance Use Topics    Alcohol use: Yes     Alcohol/week: 3.0 standard drinks of alcohol     Types: 3 Cans of beer per week     Comment: 2-3 drinks per week    Drug use: No         Assessment and Plan:  Problem List Items Addressed This Visit          NEURO    Parkinson disease (HCC) - Primary       NPO for surgery   Planning bilateral STN lead placement 1/22  Inpatient status post operative  All questions answered   Hold Celebrex 1 week before surgery   Not on antiplatelet or anticoagulation       Call with questions/concerns  Total Time Today was 26 minutes in the following activities: Obtaining and/or reviewing separately obtained history, Counseling and educating the patient/family/caregiver, Referring and communication with other health care professionals (when not separately reported), Documenting clinical information in the electronic or other health record, and Care coordination (not separately reported)     Colette Ribas, AGACNP-BC  Neurosurgery Nurse Practitioner  [facility]   Pager: [phone]

## 2023-03-02 ENCOUNTER — Inpatient Hospital Stay: Admit: 2023-03-02 | Discharge: 2023-03-03 | Disposition: A | Discharge status: Home / Self Care | Payer: MEDICARE

## 2023-03-02 ENCOUNTER — Encounter: Admit: 2023-03-02 | Discharge: 2023-03-02 | Payer: MEDICARE

## 2023-03-02 ENCOUNTER — Inpatient Hospital Stay: Admit: 2023-03-02 | Discharge: 2023-03-02 | Payer: MEDICARE

## 2023-03-02 DIAGNOSIS — G20A2 Parkinson's disease without dyskinesia, with fluctuations (HCC): Secondary | ICD-10-CM

## 2023-03-02 MED ORDER — ROSUVASTATIN 10 MG PO TAB
10 mg | Freq: Every evening | ORAL | 0 refills | Status: DC
Start: 2023-03-02 — End: 2023-03-03
  Administered 2023-03-03: 04:00:00 10 mg via ORAL

## 2023-03-02 MED ORDER — LABETALOL 5 MG/ML IV SOLN
5 mg | INTRAVENOUS | 0 refills | Status: DC | PRN
Start: 2023-03-02 — End: 2023-03-03

## 2023-03-02 MED ORDER — BUPIVACAINE HCL 0.25 % (2.5 MG/ML) IJ SOLN
0 refills | Status: DC
Start: 2023-03-02 — End: 2023-03-03

## 2023-03-02 MED ORDER — FENTANYL CITRATE (PF) 50 MCG/ML IJ SOLN
INTRAVENOUS | 0 refills | Status: DC
Start: 2023-03-02 — End: 2023-03-03

## 2023-03-02 MED ORDER — LIDOCAINE (PF) 200 MG/10 ML (2 %) IJ SYRG
INTRAVENOUS | 0 refills | Status: DC
Start: 2023-03-02 — End: 2023-03-03

## 2023-03-02 MED ORDER — ACETAMINOPHEN 325 MG PO TAB
650 mg | ORAL | 0 refills | Status: DC | PRN
Start: 2023-03-02 — End: 2023-03-03
  Administered 2023-03-03: 04:00:00 650 mg via ORAL

## 2023-03-02 MED ORDER — FENTANYL CITRATE (PF) 50 MCG/ML IJ SOLN
12.5 ug | INTRAVENOUS | 0 refills | Status: DC | PRN
Start: 2023-03-02 — End: 2023-03-03

## 2023-03-02 MED ORDER — ANTIBIOTIC BEADS, STIMULAN WITH VANCOMYCIN, GENTAMICIN (OR)(OSM)
0 refills | Status: DC
Start: 2023-03-02 — End: 2023-03-03
  Administered 2023-03-02 (×2): 2 mL

## 2023-03-02 MED ORDER — DEXAMETHASONE SODIUM PHOSPHATE 4 MG/ML IJ SOLN
INTRAVENOUS | 0 refills | Status: DC
Start: 2023-03-02 — End: 2023-03-03

## 2023-03-02 MED ORDER — PHENYLEPHRINE HCL IN 0.9% NACL 1 MG/10 ML (100 MCG/ML) IV SYRG
INTRAVENOUS | 0 refills | Status: DC
Start: 2023-03-02 — End: 2023-03-03

## 2023-03-02 MED ORDER — PHENYLEPHRINE 80MCG/ML IN NS IV DRIP (STD CONC)
INTRAVENOUS | 0 refills | Status: DC
Start: 2023-03-02 — End: 2023-03-03
  Administered 2023-03-02: 21:00:00 100 ug via INTRAVENOUS
  Administered 2023-03-02: 21:00:00 .5 ug/kg/min via INTRAVENOUS
  Administered 2023-03-02: 21:00:00 100 ug via INTRAVENOUS
  Administered 2023-03-02: 21:00:00 .5 ug/kg/min via INTRAVENOUS

## 2023-03-02 MED ORDER — FENTANYL CITRATE (PF) 50 MCG/ML IJ SOLN
25 ug | INTRAVENOUS | 0 refills | Status: DC | PRN
Start: 2023-03-02 — End: 2023-03-03

## 2023-03-02 MED ORDER — CEFAZOLIN 2 GRAM IV SOLR
2 g | INTRAVENOUS | 0 refills | Status: CP
Start: 2023-03-02 — End: ?
  Administered 2023-03-03 (×2): 2 g via INTRAVENOUS

## 2023-03-02 MED ORDER — SODIUM CHLORIDE 0.9% IV SOLP
1000 mL | INTRAVENOUS | 0 refills | Status: DC
Start: 2023-03-02 — End: 2023-03-03

## 2023-03-02 MED ORDER — CEFAZOLIN 2 GRAM IV SOLR
INTRAVENOUS | 0 refills | Status: DC
Start: 2023-03-02 — End: 2023-03-03

## 2023-03-02 MED ORDER — BACITRACIN ZINC 500 UNIT/GRAM TP OINT
0 refills | Status: DC
Start: 2023-03-02 — End: 2023-03-03

## 2023-03-02 MED ORDER — LIDOCAINE-EPINEPHRINE 1 %-1:100,000 IJ SOLN
0 refills | Status: DC
Start: 2023-03-02 — End: 2023-03-03

## 2023-03-02 MED ORDER — OXYCODONE 5 MG PO TAB
5 mg | Freq: Once | ORAL | 0 refills | Status: DC | PRN
Start: 2023-03-02 — End: 2023-03-03

## 2023-03-02 MED ORDER — IPRATROPIUM-ALBUTEROL 0.5 MG-3 MG(2.5 MG BASE)/3 ML IN NEBU
3 mL | RESPIRATORY_TRACT | 0 refills | Status: DC | PRN
Start: 2023-03-02 — End: 2023-03-03

## 2023-03-02 MED ORDER — ARTIFICIAL TEARS (PF) SINGLE DOSE DROPS GROUP
OPHTHALMIC | 0 refills | Status: DC
Start: 2023-03-02 — End: 2023-03-03

## 2023-03-02 MED ORDER — HYDROMORPHONE (PF) 2 MG/ML IJ SYRG
INTRAVENOUS | 0 refills | Status: DC
Start: 2023-03-02 — End: 2023-03-03

## 2023-03-02 MED ORDER — NALOXONE 0.4 MG/ML IJ SOLN
.04 mg | INTRAVENOUS | 0 refills | Status: DC | PRN
Start: 2023-03-02 — End: 2023-03-03

## 2023-03-02 MED ORDER — HYDRALAZINE 20 MG/ML IJ SOLN
5 mg | INTRAVENOUS | 0 refills | Status: DC | PRN
Start: 2023-03-02 — End: 2023-03-03

## 2023-03-02 MED ORDER — CARBIDOPA-LEVODOPA 25-100 MG PO TAB
1.5 | Freq: Three times a day (TID) | ORAL | 0 refills | Status: DC
Start: 2023-03-02 — End: 2023-03-03
  Administered 2023-03-03 (×2): 1.5 via ORAL

## 2023-03-02 MED ORDER — CEFAZOLIN 1GM NS IRR
0 refills | Status: DC
Start: 2023-03-02 — End: 2023-03-03
  Administered 2023-03-02 (×2): 1000 mL

## 2023-03-02 MED ORDER — OXYCODONE 5 MG PO TAB
5 mg | ORAL | 0 refills | Status: DC | PRN
Start: 2023-03-02 — End: 2023-03-03

## 2023-03-02 MED ORDER — LIDOCAINE (PF) 10 MG/ML (1 %) IJ SOLN
.2 mL | INTRAMUSCULAR | 0 refills | Status: DC | PRN
Start: 2023-03-02 — End: 2023-03-03

## 2023-03-02 MED ORDER — METHOCARBAMOL 500 MG PO TAB
1000 mg | Freq: Four times a day (QID) | ORAL | 0 refills | Status: DC
Start: 2023-03-02 — End: 2023-03-03
  Administered 2023-03-03 (×2): 1000 mg via ORAL

## 2023-03-02 MED ORDER — ROCURONIUM 10 MG/ML IV SOLN
INTRAVENOUS | 0 refills | Status: DC
Start: 2023-03-02 — End: 2023-03-03

## 2023-03-02 MED ORDER — MEPERIDINE (PF) 25 MG/ML IJ SYRG
12.5 mg | INTRAVENOUS | 0 refills | Status: DC | PRN
Start: 2023-03-02 — End: 2023-03-03

## 2023-03-02 MED ORDER — ACETAMINOPHEN 500 MG PO TAB
1000 mg | Freq: Once | ORAL | 0 refills | Status: DC | PRN
Start: 2023-03-02 — End: 2023-03-03

## 2023-03-02 MED ORDER — PROPOFOL INJ 10 MG/ML IV VIAL
INTRAVENOUS | 0 refills | Status: DC
Start: 2023-03-02 — End: 2023-03-03

## 2023-03-02 MED ORDER — HYDROMORPHONE (PF) 2 MG/ML IJ SYRG
.5 mg | INTRAVENOUS | 0 refills | Status: DC | PRN
Start: 2023-03-02 — End: 2023-03-03

## 2023-03-02 MED ORDER — ONDANSETRON HCL (PF) 4 MG/2 ML IJ SOLN
INTRAVENOUS | 0 refills | Status: DC
Start: 2023-03-02 — End: 2023-03-03

## 2023-03-02 MED ORDER — HYDROMORPHONE (PF) 2 MG/ML IJ SYRG
1 mg | INTRAVENOUS | 0 refills | Status: DC | PRN
Start: 2023-03-02 — End: 2023-03-03

## 2023-03-02 MED ORDER — ONDANSETRON HCL (PF) 4 MG/2 ML IJ SOLN
4 mg | INTRAVENOUS | 0 refills | Status: DC | PRN
Start: 2023-03-02 — End: 2023-03-03

## 2023-03-02 MED ORDER — SUGAMMADEX 100 MG/ML IV SOLN
INTRAVENOUS | 0 refills | Status: DC
Start: 2023-03-02 — End: 2023-03-03

## 2023-03-02 MED ORDER — ALLOPURINOL 100 MG PO TAB
100 mg | Freq: Every evening | ORAL | 0 refills | Status: DC
Start: 2023-03-02 — End: 2023-03-03
  Administered 2023-03-03: 04:00:00 100 mg via ORAL

## 2023-03-02 MED ORDER — CARBIDOPA-LEVODOPA 25-100 MG PO TAB
.5 | Freq: Once | ORAL | 0 refills | Status: CP
Start: 2023-03-02 — End: ?
  Administered 2023-03-02: 18:00:00 0.5 via ORAL

## 2023-03-02 MED ORDER — BENZTROPINE 1 MG PO TAB
.5 mg | Freq: Two times a day (BID) | ORAL | 0 refills | Status: DC
Start: 2023-03-02 — End: 2023-03-03
  Administered 2023-03-03 (×2): 0.5 mg via ORAL

## 2023-03-02 MED ADMIN — SODIUM CHLORIDE 0.9% IV SOLP [27838]: 1000 mL | INTRAVENOUS | @ 18:00:00 | Stop: 2023-03-02 | NDC 00338004904

## 2023-03-02 NOTE — H&P (View-Only)
Admission History and Physical Examination    Name: Mitchell Burnett   MRN: [id number]     DOB: [birth year]      Age: 73 y.o.  Admission Date: 03/02/2023     LOS: 0 days     Date of Service: 03/02/2023                       Assessment/Plan:    Active Problems:    * No active hospital problems. *    69M w/ Parkinson's disease admitted for bilateral STN DBS Stage I and II. Plan for neurosurgery floor post-op    __________________________________________________________________________________  Primary Care Physician: Wilford Grist  Verified    Chief Complaint: Tremor    History of Present Illness: Mitchell Burnett is a 73 y.o. male w/ Hx of Parkinson's disease who presents for bilateral STN DBS implantation. We discussed the risks and benefits of the procedure. He wishes to proceed. Family at bedside understands and is also in agreement.       Past Medical History:    Abnormal involuntary movement    Arthritis    BPH (benign prostatic hyperplasia)    Cataract    HLD (hyperlipidemia)    Hypertension    Movement disorder    Parkinson disease (HCC)    Vision decreased     Surgical History:   Procedure Laterality Date    SHOULDER SURGERY  1983    Right- dislocated    CERVICAL SPINE SURGERY  2009    SHOULDER REPLACEMENT Right 2012    LEFT COMPLEX TRIPLE ARTHRODESIS Left 12/04/2018    Performed by Ree Shay, MD at [facility] OR    LEFT FIRST TARSOMETATARSAL ARTHRODESIS Left 12/04/2018    Performed by Ree Shay, MD at [facility] OR    LEFT LATERAL ANLKE LIGAMENT Left 12/04/2018    Performed by Ree Shay, MD at [facility] OR    LEFT ACHILLES TENDON LENGTHENING  Left 12/04/2018    Performed by Ree Shay, MD at [facility] OR    LEFT  FIRST METATARSOPHALANGEAL JOINT RTS IMPLANT Left 12/04/2018    Performed by Ree Shay, MD at [facility] OR    CENTRALIZATION TIBIALIS ANTERIOR TENDON Left 12/04/2018    Performed by Ree Shay, MD at [facility] OR    ARTHRODESIS LEFT GREAT TOE - INTERPHALANGEAL JOINT Left 05/14/2019    Performed by Rayford Halsted, MD at [facility] OR    LEFT OPEN FLEXOR HALLUCIS TENOTOMY Left 05/14/2019    Performed by Rayford Halsted, MD at [facility] OR    CATARACT REMOVAL Bilateral 01/2020    COLONOSCOPY      HX TURP       Family History   Problem Relation Name Age of Onset    Cancer Mother      Dementia Sister      Cancer Brother      Diabetes Neg Hx      Gout Neg Hx      SLE Neg Hx      Stroke Neg Hx      Psoriasis Neg Hx      Heart Disease Neg Hx      Kidney Disease Neg Hx      Parkinson's  Neg Hx      Tremor Neg Hx       Social History     Tobacco Use    Smoking status: Never    Smokeless tobacco: Never   Vaping Use    Vaping status:  Never Used   Substance and Sexual Activity    Alcohol use: Yes     Alcohol/week: 3.0 standard drinks of alcohol     Types: 3 Cans of beer per week     Comment: 2-3 drinks per week    Drug use: No           Immunizations (includes history and patient reported):   Immunization History   Administered Date(s) Administered    COVID-19 (MODERNA), mRNA vacc, 100 mcg/0.5 mL (PF) 03/08/2019, 04/05/2019, 12/01/2019, 08/07/2020    Covid-19 mRNA Vaccine >=12yo (Moderna)(Spikevax) 12/14/2021           Allergies:  Pcn [penicillins]    Medications:  Medications Prior to Admission   Medication Sig    allopurinoL (ZYLOPRIM) 100 mg tablet Take one tablet by mouth at bedtime daily. Take with food.    Apple Cider Vinegar 300 mg tab Take one tablet by mouth daily as needed.    benztropine (COGENTIN) 0.5 mg tablet TAKE 1 TABLET BY MOUTH TWICE DAILY FOR  TREMOR    calcium carbonate (TUMS) 500 mg (200 mg elemental calcium) chewable tablet Chew one tablet by mouth daily as needed.    carbidopa-levodopa (SINEMET) 25-100 mg tablet One and 1/2  pill three times a day. 7 am, 11 am and between 4 and 5 pm    celecoxib (CELEBREX) 200 mg capsule Take one capsule by mouth daily.    rosuvastatin (CRESTOR) 10 mg tablet Take one tablet by mouth at bedtime daily.       ROS    Physical Exam  Vital Signs: Last Filed In 24 Hours Vital Signs: 24 Hour Range   BP: 157/81 (01/22 1127)  Temp: 36.7 ?C (98.1 ?F) (01/22 1105)  Pulse: 68 (01/22 1127)  Respirations: 18 PER MINUTE (01/22 1127)  SpO2: 98 % (01/22 1127)  O2 Device: None (Room air) (01/22 1127)  Height: 177.8 cm (5' 10) (01/22 1105) BP: (157)/(81)   Temp:  [36.7 ?C (98.1 ?F)]   Pulse:  [68]   Respirations:  [18 PER MINUTE]   SpO2:  [98 %]   O2 Device: None (Room air)          AA  Conversant  Speech fluent  MAEW with good strength  CN II-XII GI  Sensation GI  Bilateral resting tremor      Lab/Radiology/Other Diagnostic Tests:  24-hour labs:  No results found for this visit on 03/02/23 (from the past 24 hours).     Pertinent radiology reviewed.    Salvadore Oxford, DO  Pager [phone]

## 2023-03-02 NOTE — Progress Notes
RT Adult Assessment Note    NAME:Mitchell Burnett             MRN: [id number]             DOB:03-11-1950          AGE: 73 y.o.  ADMISSION DATE: 03/02/2023             DAYS ADMITTED: LOS: 0 days    Additional Comments:  Impressions of the patient: NAD  Intervention(s)/outcome(s): RT Eval. Post Op    Vital Signs:  Pulse: 81  RR: 14 PER MINUTE  SpO2: 100 %  O2 Device: None (Room air)  Liter Flow: 2 Lpm  O2%:      Breath Sounds:   All Breath Sounds: Clear (Implies normal);Decreased  Respiratory Effort:   Respiratory Effort/Pattern: Unlabored  Comments:

## 2023-03-02 NOTE — Operative Report(Direct Entry)
OPERATIVE REPORT    Name: Mitchell Burnett is a 73 y.o. 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]    Post-op Diagnosis      * Parkinson's disease with fluctuating manifestations, unspecified whether dyskinesia present (HCC) [G20.A2]    Procedure(s) (LRB):  Bilateral Subthalmic Nucleus Deep Brain Stimulation electrode insertion (Bilateral)  TWIST DRILL/ BURR HOLE/ CRANIOTOMY/ CRANIECTOMY WITH STEREOTACTIC IMPLANTATION NEUROSTIMULATOR ELECTRODE ARRAY WITHOUT INTRAOPERATIVE MICROELCTRODE RECORDING - EACH ADDITIONAL ARRAY  INSERTION OR REPLACEMENT, PULSE GENERATOR OR RECEIVER, NEUROSTIMULATOR, CRANIAL, WITH 2 OR MORE ELECTRODE ARRAYS          Description and Findings of Operative Procedure:    Device: [place]  Location: Subthalamic Nucleus  Battery: left chest    The patient was properly identified and brought into the operating room. Sequential compression devices were placed on both lower extremities for DVT prophylaxis.  Intravenous antibiotics were administered within one hour of skin incision for antibiotic prophylaxis. After administration of IV sedation and general anesthesia, the patient was placed in a supine position.     The CRW head frame applied by placing the frame in appropriate position and tightening the 4 pins to two-finger tightness. The patient was moved to the operating room table in a supine position with their head in the CRW frame which was attached to the [facility] CRW adapter. Special care was taken to ensure that all pressure points were appropriately padded. The E3D was brought around the patient and the CRW fiducial system was placed over the patient's head. A thin-slice stereotactic scan for fiducial registration was performed with the E3D. This localizing scan was subsequently merged with the pre-existing image set consisting of the preoperative MRI and trajectory plans in order to provide the stereotactic reference.  Each of the trajectories were verified on the [employer]. Staples placed prior to the CT spin were used as fiducials to verify the registration scan accuracy.  The patients head was prepped and draped in the standard sterile fashion. Entry sites were marked with the [place] laser.    We began with the left trajectory. After infiltration of the skin with lidocaine, an approximately 4cm skin incision was made with a #15 blade.  The dissection was carried down to the skull and the periosteum was elevated.  A dissector was used to elevate the periosteum towards the patient's contralateral side to allow for subcutaneous lead passage once placed. The incision was held open with a Horticulturist, commercial.  The robot arm was brought into target position.  At this point, a high speed drill was used to shape the skull in a manner as to ensure that the intended trajectory would be perpendicular to the skull surface.  With the robot arm set to 190 mm from target a 3.40mm drill bit was used to drill a craniostomy to the dura through the trajectory determined by the [place] and careful measurement.  Bleeding was stopped with floseal prior to opening the dura.  Dura was opened with  an insulated monopolar cautery via the 1.65mm Renshaw tool holder.  Monopolar was set to 20.    A 1.37mm diameter to-target cannula was brought into the field and via the 1.70mm Renshaw tool holder introduced into the brain and slowly advanced to a pre-measured target depth.  The electrode was prepared on the back table with the appropriate skull and tool holder depth marks with a permanent marker.  The cannula was removed and replaced with the electrode. The electrode was held in position with a bayonet forceps and the inner stylet of the electrode was removed.  The lead was secured with an insulated KLS martin dog bone cranial plate. Appropriate location of the skull marking was noted on the lead.    The same process was repeated on the right side.      An E3D spin was then acquired to check the 3D position of the electrode.  The E3D acquisition was brought into the planning system and merged with the preoperative MRI, as well as the trajectory plan.  At this time the deviation from the check was less than 1.71mm and the electrode rested within the Subthalamic Nucleus.         With the electrodes in place, an incision was planned over the left occipital bone and the dissection was carried down to periosteum.  A tunneling tool was then used to pass the electrodes to this intermediate incision.  A strain relief loop was placed around the entry sites of both electrodes and all hardware was coiled in the subgaleal space.  The wounds were copiously irrigated with ancef-impregnated solution.  The 2 frontal incisions were closed with 2-0 Vicryl sutures for galea and 3-0 Rapide sutures for skin. The incisions were cleaned. The surgical drapes were taken down. The patient was then disengaged from the stereotactic frame without difficulty.       Attention was then turned towards implantation of the extensions as well as the implantable pulse generator.  The patient's head was turned to expose the parietal incision and a bump was placed under the ipsilateral shoulder. The head, neck, and chest were then prepped and draped in a standard sterile fashion. An incision was made over the left  subclavicular region with a #10 blade. An extension was tunneled subcutaneously connecting the two incisions.  The electrode was connected via the extension to the implantable pulse generator over the anterior chest wall in the recommended manner.  The connections were tested with all normal impedances noted. The battery was tacked with ethibond to prevent its migration in the subclavicular space. The wound was copiously irrigated with ancef-impregnated solution and the fascia, dermis and skin were closed with 2-0 Vicryl and a subcuticular 4-0 Monocryl for skin.  The scalp incision was closed with 2-0 vicryl and 3-0 Vicryl Rapide.  The incisions were cleaned.  The chest incision was dressed with dermabond. The surgical drapes were taken down.      The cranial incisions were dressed with bacitracin, xeroform, and Telfa. The patient was able to be extubated without difficulty and remained at neurological baseline having tolerated procedure well.  The patient was taken to the post-anesthetic care unit for postoperative recovery. All sponge, needle, and instrument counts were correct at the end of the procedure.  I was present for all key portions of the procedure.     Estimated Blood Loss:  No blood loss documented.     Specimen(s) Removed/Disposition: * No specimens in log *    Attestation: I performed  this procedure with a resident.    Complications:  None      Implants:   Implant Name Serial No. Manufacturer Lot No. LRB No. Used Action   LEAD KIT DIRECTIONAL 45CM Gracelyn Nurse - [id number] [employer] NA Right 1 Implanted   LEAD KIT DIRECTIONAL 45CM VERCISE CARTESIA - [id number] [employer] NA Left 1 Implanted   SCREW BONE LEVEL ONE TITANIUM CRANIOMAXILLOFACIAL DRILL FREE - SNA NA [employer]ARTIN [employer] NA Left 4 Implanted   PLATE BONE 4.6EV LONG STRAIGHT CRANIAL TITANIUM 2 HOLE LOW - SNA NA [employer]MARTIN [employer] NA Left 2 Implanted   KIT BONE GRAFT 3CC 7CC CALCIUM SULFATE STIMULAN RAPID CURE - SNA NA [employer] [id number] Left 1 Implanted   Kit IPG Torque Wrench Header Port Plug Template Vercise Genus R16 Implant Kit Neuromodulation - SNA NA [employer] [id number] Left 1 Implanted   KIT NEUROSTIMULATOR 55CM LEAD EXTENSION 8 CONTACT VERCISE - [id number] [employer] NA Right 1 Implanted   KIT NEUROSTIMULATOR 55CM LEAD EXTENSION 8 CONTACT VERCISE - [id number] [employer] NA Left 1 Implanted       Drains: Details on drains available on LDA report    Disposition:  PACU - stable          Salvadore Oxford, DO  Pager [phone]

## 2023-03-02 NOTE — Progress Notes
Chaplain Note:    Admit Date: 03/02/2023         I encounter Mr. Gillott's son in the hallway who asked me to come say a prayer for him. I provided a caring presence and prayer.     The On-Call Chaplain is available on Voalte or can be paged via the switchboard [phone]) for urgent and emergent needs.   The Spiritual Care team responds to other requests within 24-hours when submitted as a Chaplain Consult in O2.         Date/Time:                      User:                                      03/02/2023 8:06 PM Andrey Spearman      PCU 2 PCU

## 2023-03-03 ENCOUNTER — Encounter: Admit: 2023-03-03 | Discharge: 2023-03-03 | Payer: MEDICARE

## 2023-03-03 DIAGNOSIS — E785 Hyperlipidemia, unspecified: Secondary | ICD-10-CM

## 2023-03-03 DIAGNOSIS — H547 Unspecified visual loss: Secondary | ICD-10-CM

## 2023-03-03 DIAGNOSIS — I10 Essential (primary) hypertension: Secondary | ICD-10-CM

## 2023-03-03 DIAGNOSIS — Z96611 Presence of right artificial shoulder joint: Secondary | ICD-10-CM

## 2023-03-03 DIAGNOSIS — M1990 Unspecified osteoarthritis, unspecified site: Secondary | ICD-10-CM

## 2023-03-03 DIAGNOSIS — N400 Enlarged prostate without lower urinary tract symptoms: Secondary | ICD-10-CM

## 2023-03-03 MED ORDER — ACETAMINOPHEN 325 MG PO TAB
650 mg | ORAL | 0 refills | 8.00000 days | Status: AC | PRN
Start: 2023-03-03 — End: ?

## 2023-03-03 MED ORDER — OXYCODONE 5 MG PO TAB
5 mg | ORAL_TABLET | ORAL | 0 refills | 6.00000 days | Status: AC | PRN
Start: 2023-03-03 — End: ?
  Filled 2023-03-03: qty 10, 2d supply, fill #1

## 2023-03-03 MED ORDER — CELECOXIB 200 MG PO CAP
200 mg | Freq: Every day | ORAL | 0 refills | 30.00000 days | Status: AC
Start: 2023-03-03 — End: ?

## 2023-03-03 NOTE — Care Coordination-Inpatient
Kenzel P Novitski    Bilateral Subthalamic Nucleus Deep Brain Stimulation Electrode Insertion, Insertion Pulse Generator on 03/02/2023 with Gwyndolyn Kaufman, MD    Neurosurgery Discharge Instructions    Contact information:  Call the Neurosurgery clinic if you have questions or are experiencing problems at [phone].   After 5 PM, weekends, holidays please call [phone] to reach Neurosurgery on call.  Post-operative wound care:  Your incision has dissolving sutures/glue in place.  Your incision may be open to air.  Keep your incision dry for 5 days. Shower neck down until 03/06/23.  Starting 03/07/23 use non-medicated soap to wash incision daily, pat dry and leave open to air.    Avoid heavy water pressure over incision site.  Only touch your incision with clean hands. Do not apply lotion, cream, or ointments to incision.  Do not submerge (pool/tub) incision under water at all for 4 weeks.  Have someone look at your incision every day. It should look the same or better each day.  Activity restrictions:  Avoid pushing, pulling, or lifting more than 10 pounds (about a gallon of milk). If you hold children, they should be placed in your lap or crawl into lap if old enough.   Do NOT drive until you are cleared by your physician.  Avoid bearing down or straining to have bowel movements.  You must be off all pain medication before driving restriction is released.  Walking is the best initial therapy for recovery.  Walk several times daily as tolerated.  Post-operative pain and medications:  Please use your pain medications and muscle relaxers as prescribed.    Do not take NSAIDS (Ibuprofen, Advil, Aleve, Motrin, Naproxen) until Doctor approved.  Pain medications can make you constipated.  Please take stool softeners to aid this process.  Tylenol is approved for pain control.  This is available over the counter.  Follow up appointment:  Scheduled appointments:      Mar 16, 2023 11:00 AM  Postoperative visit with Rayne Du, APRN-NP  Neurosurgery: [facility] (NeuroSurgery) [address]  [address], [address] [place] [address]  [phone]     Apr 06, 2023 2:00 PM  Procedure with Barbaraann Share, MD  Neurology: [facility] (Neurology) [address]  [place] [address]  [phone]     Apr 22, 2023 11:45 AM  Postoperative visit with Emogene Morgan, MD  Neurosurgery: [facility] (NeuroSurgery) [address]  [address], [address] [place] [address]  [phone]        Reasons to call the Neurosurgery Clinic [phone]):  Concerning lethargy (sleepiness), decreased level of consciousness, new confusion.  Worsening vision, facial weakness, tongue weakness, new hearing, or balance difficulties.  Fever 101 or greater.  Redness or swelling of incision.   Continuous oozing or persistent clear fluid coming from incision.  Headaches increasing in severity and occurrences.  A noticeable and increasing fluid collection developing under the skin, around your incision.   Intense pain that is getting worse or unrelieved by pain medications or muscle relaxers.    Education document reviewed with patient at bedside.  Incisions with dissolving sutures/glue/OTA.  Advised of wound care, activity restrictions and goals of mobility in recovery.  Discussed prescribed medications for optimal pain control.  Confirmed upcoming appointments, all questions answered, encouraged to call with any changes/needs/concerns.    Kem Kays, RN  Clinical Nurse Coordinator  Neurosurgery  Available via Amie Critchley

## 2023-03-03 NOTE — Discharge Instructions - Pharmacy
Discharge Summary      Name: Mitchell Burnett  Medical Record Number: [id number]        Account Number:  [id number]  Date Of Birth:  October 06, 1950                         Age:  73 y.o.  Admit date:  03/02/2023                     Discharge date: 03/03/2023      Discharge Attending:  Lorel Monaco   Discharge Summary Completed By: Vance Gather, APRN-NP    Service: Surgery-Neuro    Reason for hospitalization:  Parkinson's disease with fluctuating manifestations, unspecified whether dyskinesia present (HCC) [G20.A2]  History of Parkinson's disease [Z86.69]    Primary Discharge Diagnosis:   History of Parkinson's disease    Hospital Diagnoses:  Hospital Problems        Active Problems    * (Principal) History of Parkinson's disease    Parkinson disease (HCC)    S/P deep brain stimulator placement     Present on Admission:   History of Parkinson's disease   Parkinson disease (HCC)        Significant Past Medical History        Abnormal involuntary movement  Arthritis  BPH (benign prostatic hyperplasia)      Comment:  prostatectomy 02/2022  Cataract  HLD (hyperlipidemia)  Hypertension  Movement disorder  Parkinson disease (HCC)  Vision decreased    Allergies   Pcn [penicillins]    Brief Hospital Course   The patient was admitted and the following issues were addressed during this hospitalization: (with pertinent details including admission exam/imaging/labs).  The patient presented for planned DBS lead and generator placement. The patient did well post operative. Tolerate po diet. Voiding independently. Post operative pain well managed on oral agents. All questions answered. The patient plans to follow up as scheduled.        Items Needing Follow Up   Pending items or areas that need to be addressed at follow up: none    Pending Labs and Follow Up Radiology    Pending labs and/or radiology review at this time of discharge are listed below: Please note- any labs with collected status will not have a result; if this area is blank, there are no items for review.         Medications      Medication List      START taking these medications     acetaminophen 325 mg tablet; Commonly known as: TYLENOL; Dose: 650 mg;   Take two tablets by mouth every 4 hours as needed. Indications: pain; For:   pain; Refills: 0   oxyCODONE 5 mg tablet; Commonly known as: ROXICODONE; Dose: 5 mg; Take   one tablet by mouth every 4 hours as needed. Indications: pain; For: pain;   Quantity: 10 tablet; Refills: 0     CHANGE how you take these medications     celecoxib 200 mg capsule; Commonly known as: CeleBREX; Dose: 200 mg;   Take one capsule by mouth daily. Indications: pain; For: pain; Refills: 0;   Start taking on: March 07, 2023; What changed: These instructions start   on March 07, 2023. If you are unsure what to do until then, ask your   doctor or other care provider.     CONTINUE taking these medications     allopurinoL 100 mg tablet; Commonly  known as: ZYLOPRIM; Dose: 100 mg;   Refills: 0   Apple Cider Vinegar 300 mg Tab; Dose: 1 tablet; Refills: 0   benztropine 0.5 mg tablet; Commonly known as: COGENTIN; TAKE 1 TABLET BY   MOUTH TWICE DAILY FOR  TREMOR; Quantity: 180 tablet; Refills: 3   calcium carbonate 500 mg (200 mg elemental calcium) chewable tablet;   Commonly known as: TUMS; Dose: 1 tablet; Refills: 0   carbidopa-levodopa 25-100 mg tablet; Commonly known as: SINEMET; One and   1/2  pill three times a day. 7 am, 11 am and between 4 and 5 pm; Quantity:   405 tablet; Refills: 11   rosuvastatin 10 mg tablet; Commonly known as: CRESTOR; Dose: 10 mg;   Refills: 0       Return Appointments and Scheduled Appointments     Scheduled appointments:      Mar 16, 2023 11:00 AM  Postoperative visit with Rayne Du, APRN-NP  Neurosurgery: [facility] (NeuroSurgery) [address]  [address], [address] [place] [address]  [phone]     Apr 06, 2023 2:00 PM  Procedure with Barbaraann Share, MD  Neurology: [facility] (Neurology) [address]  [place] [address]  [phone]     Apr 22, 2023 11:45 AM  Postoperative visit with Emogene Morgan, MD  Neurosurgery: [facility] (NeuroSurgery) [address]  [address], [address] [place] [address]  [phone]     Jun 08, 2023 9:00 AM  Procedure with Barbaraann Share, MD  Neurology: [facility] (Neurology) [address]  [place] [address]  [phone]            Consults, Procedures, Diagnostics, Micro, Pathology   Consults: None  Surgical Procedures & Dates:  1/22 bilateral STN leads and generator placement    Significant Diagnostic Studies, Micro and Procedures: none  Significant Pathology: none              Wound:      Wounds Surgical incision Left;Right Head (Active)   03/02/23 1625   Wound Type: Surgical incision   Orientation: Left;Right   Location: Head   Wound Location Comments:    Initial Wound Site Closure: Sutures   Initial Dressing Placed: Xeroform gauze;Telfa;Bacitracin   Initial Cycle:    Initial Suction Setting (mmHg):    Pressure Injury Stages:    Pressure Injury Present Within 24 Hours of Hospital Admission:    If This Pressure Injury Is Suspected to Be Device Related, Please Select the Device::    Is the Wound Open or Closed:    Wound Assessment Wound edges approximated 03/03/23 0825   Wound Site Closure Sutures 03/03/23 0825   Peri-wound Assessment Dry 03/03/23 0825   Wound Drainage Amount None 03/03/23 0825   Wound Dressing Status None/open to air 03/03/23 0825   Wound Dressing and/or Treatment Other (Comment) 03/03/23 0825   Number of days: 1       Wounds Surgical incision Left Chest (Active)   03/02/23 1704   Wound Type: Surgical incision   Orientation: Left   Location: Chest   Wound Location Comments:    Initial Wound Site Closure: Sutures;Topical skin adhesive (e.g. Dermabond)   Initial Dressing Placed:    Initial Cycle:    Initial Suction Setting (mmHg):    Pressure Injury Stages:    Pressure Injury Present Within 24 Hours of Hospital Admission:    If This Pressure  Injury Is Suspected to Be Device Related, Please Select the Device::    Is the Wound Open or Closed:    Wound Assessment Wound edges approximated 03/03/23 0825   Wound Site Closure Sutures;Topical skin adhesive (e.g. Dermabond) 03/03/23 0825   Peri-wound Assessment Dry 03/03/23 0825   Wound Drainage Amount None 03/03/23 0825   Wound Dressing Status None/open to air 03/03/23 0825   Number of days: 1       Wounds Surgical incision Right;Posterior Head (Active)   03/02/23 1724   Wound Type: Surgical incision   Orientation: Right;Posterior   Location: Head   Wound Location Comments: pin site   Initial Wound Site Closure: Sutures   Initial Dressing Placed:    Initial Cycle:    Initial Suction Setting (mmHg):    Pressure Injury Stages:    Pressure Injury Present Within 24 Hours of Hospital Admission:    If This Pressure Injury Is Suspected to Be Device Related, Please Select the Device::    Is the Wound Open or Closed:    Wound Assessment Wound edges approximated 03/03/23 0825   Wound Site Closure Sutures 03/03/23 0825   Peri-wound Assessment Dry 03/03/23 0825   Wound Drainage Amount None 03/03/23 0825   Wound Drainage Description Bloody 03/02/23 1930   Wound Dressing Status None/open to air 03/03/23 0825   Number of days: 1       Wounds Surgical incision Anterior;Left Neck (Active)   03/02/23 1727   Wound Type: Surgical incision   Orientation: Anterior;Left   Location: Neck   Wound Location Comments:    Initial Wound Site Closure: Sutures;Topical skin adhesive (e.g. Dermabond)   Initial Dressing Placed:    Initial Cycle:    Initial Suction Setting (mmHg):    Pressure Injury Stages:    Pressure Injury Present Within 24 Hours of Hospital Admission:    If This Pressure Injury Is Suspected to Be Device Related, Please Select the Device::    Is the Wound Open or Closed:    Wound Assessment Wound edges approximated 03/03/23 0825   Wound Site Closure Sutures 03/03/23 0825   Peri-wound Assessment Dry 03/03/23 0825   Wound Drainage Amount None 03/03/23 0825   Wound Dressing Status None/open to air 03/03/23 0825   Number of days: 1                           Discharge Disposition, Condition   Patient Disposition: home  Condition at Discharge: Stable    Code Status   Full Code    Patient Instructions     Activity       Lifting Restrictions   As directed      Do not lift more than 10 pounds until after follow-up appointment.          Diet       Regular Diet   As directed      You have no dietary restriction. Please continue with a healthy balanced diet.          Wounds/Lines/Drains       Incision Care   As directed      *Keep your incision clean and dry.  *May shower 5 days following procedure. Avoid direct water contact to the incision. Take sponge baths, working around the incision during this time.  *Do not submerge incision in tub, pool, hot tub, or lake for 4 weeks.  *Usually there are not stitches to be removed. Steri-strips (strips  of tape) will begin to fall off in 10-14 days. If they remain after 2 weeks, gently remove them when they are damp after a shower.  *Your incision should gradually look better each day. If you notice unusual swelling, redness, drainage, have increasing pain at the site, or have a fever greater than 100 degrees, notify your physician immediately.             Discharge education provided to patient., Signs and Symptoms:   Report these signs and symptoms       Report These Signs and Symptoms   As directed      Please contact your doctor if you have any of the following symptoms: temperature higher than 100.4 degrees F, uncontrolled pain, or drainage with a foul odor        , Education: , and Others Instructions:   Other Orders       Questions About Your Stay   Complete by: As directed      Discharging attending physician: Emogene Morgan    Order comments: For questions or concerns regarding your hospital stay, call [phone].   Additional instructions:    Jereld P Charnley    Bilateral Subthalamic Nucleus Deep Brain Stimulation Electrode Insertion, Insertion Pulse Generator on 03/02/2023 with Gwyndolyn Kaufman, MD    Neurosurgery Discharge Instructions    Contact information:  Call the Neurosurgery clinic if you have questions or are experiencing problems at [phone].   After 5 PM, weekends, holidays please call [phone] to reach Neurosurgery on call.  Post-operative wound care:  Your incision has dissolving sutures/glue in place.  Your incision may be open to air.  Keep your incision dry for 5 days. Shower neck down until 03/06/23.  Starting 03/07/23 use non-medicated soap to wash incision daily, pat dry and leave open to air.    Avoid heavy water pressure over incision site.  Only touch your incision with clean hands. Do not apply lotion, cream, or ointments to incision.  Do not submerge (pool/tub) incision under water at all for 4 weeks.  Have someone look at your incision every day. It should look the same or better each day.  Activity restrictions:  Avoid pushing, pulling, or lifting more than 10 pounds (about a gallon of milk). If you hold children, they should be placed in your lap or crawl into lap if old enough.   Do NOT drive until you are cleared by your physician.  Avoid bearing down or straining to have bowel movements.  You must be off all pain medication before driving restriction is released.  Walking is the best initial therapy for recovery.  Walk several times daily as tolerated.  Post-operative pain and medications:  Please use your pain medications and muscle relaxers as prescribed.    Do not take NSAIDS (Ibuprofen, Advil, Aleve, Motrin, Naproxen) until Doctor approved.  Pain medications can make you constipated.  Please take stool softeners to aid this process.  Tylenol is approved for pain control.  This is available over the counter.  Follow up appointment:  Scheduled appointments:      Mar 16, 2023 11:00 AM  Postoperative visit with Rayne Du, APRN-NP  Neurosurgery: [facility] (NeuroSurgery) [address]  [address], [address] [place] [address]  [phone]     Apr 06, 2023 2:00 PM  Procedure with Barbaraann Share, MD  Neurology: [facility] (Neurology) [address]  [place] [address]  [phone]  Apr 22, 2023 11:45 AM  Postoperative visit with Emogene Morgan, MD  Neurosurgery: [facility] [place] [facility] (NeuroSurgery) [address]  [address], [address] [place] [address]  [phone]        Reasons to call the Neurosurgery Clinic [phone]):  Concerning lethargy (sleepiness), decreased level of consciousness, new confusion.  Worsening vision, facial weakness, tongue weakness, new hearing, or balance difficulties.  Fever 101 or greater.  Redness or swelling of incision.   Continuous oozing or persistent clear fluid coming from incision.  Headaches increasing in severity and occurrences.  A noticeable and increasing fluid collection developing under the skin, around your incision.   Intense pain that is getting worse or unrelieved by pain medications or muscle relaxers.             Additional Orders: Case Management, Supplies, Home Health     Home Health/DME       None              Signed:  Vance Gather, APRN-NP  03/03/2023      cc:  Primary Care Physician:  Wilford Grist   Verified    Referring physicians:  Self, Referral   Additional provider(s):        Did we miss something? If additional records are needed, please fax a request on office letterhead to [phone]. Please include the patient's name, date of birth, fax number and type of information needed. Additional request can be made by email at [email or web address]. For general questions of information about electronic records sharing, call [phone].

## 2023-03-03 NOTE — Progress Notes
Neurosurgery Progress Note      Admission Date: 03/02/2023 LOS: 1 day  ______________________________________________________________    S: No acute events noted. States wanting to go home. Denies pain     O:                  Vital Signs: 24 Hour Range   BP: (130-165)/(76-93)   Temp:  [36.6 ?C (97.9 ?F)-37.3 ?C (99.1 ?F)]   Pulse:  [67-82]   Respirations:  [10 PER MINUTE-18 PER MINUTE]   SpO2:  [98 %-100 %]   O2 Device: None (Room air)  O2 Liter Flow: 2 Lpm     Physical Exam:  Awake, resting in bed   Conversationally intact   MAE to command  No gross weakness bed level  Surgical site - dressing in place cranial, generator site - skin glue         A/P: 73 y.o. male   Principal Problem:    History of Parkinson's disease    Remove cranial dressing  D/c home  Follow up as scheduled   Call with questions/concerns  Voiding  Denies pain       Prophylaxis:   A) GI: None  B) Lines:  No  C) Urinary Catheter:  No  D) Antibiotic Usage:  No  E) VTE:  Mechanical prophylaxis; Sequential compression device  F) Restraints: Patient assessed for need for restraints.     Please page [phone] with any questions.    Vance Gather, APRN-NP  Vo[place] me

## 2023-03-03 NOTE — Anesthesia Pain Rounding
Anesthesia Follow-Up Evaluation: Post-Procedure Day One    Name: Mitchell Burnett     MRN: [id number]     DOB: April 07, 1950     Age: 73 y.o.     Sex: male   __________________________________________________________________________     Procedure Date: 03/02/2023   Procedure: Procedure(s) with comments:  Bilateral Subthalmic Nucleus Deep Brain Stimulation electrode insertion - Case: Bilateral STN DBS electrode insertion    O-arm or E3D  [place]  TWIST DRILL/ BURR HOLE/ CRANIOTOMY/ CRANIECTOMY WITH STEREOTACTIC IMPLANTATION NEUROSTIMULATOR ELECTRODE ARRAY WITHOUT INTRAOPERATIVE MICROELCTRODE RECORDING - EACH ADDITIONAL ARRAY  INSERTION OR REPLACEMENT, PULSE GENERATOR OR RECEIVER, NEUROSTIMULATOR, CRANIAL, WITH 2 OR MORE ELECTRODE ARRAYS    Physical Assessment  Height: 177.8 cm (5' 10)  Weight: 70 kg (154 lb 4 oz)    Vital Signs (Last Filed in 24 hours)  BP: 146/78 (01/23 0801)  Temp: 36.6 ?C (97.9 ?F) (01/23 0801)  Pulse: 67 (01/23 0801)  Respirations: 18 PER MINUTE (01/23 0801)  SpO2: 100 % (01/23 0801)  O2 Device: None (Room air) (01/23 0801)  O2 Liter Flow: 2 Lpm (01/22 2146)  Height: 177.8 cm (5' 10) (01/22 1105)    Patient History   Allergies  Allergies   Allergen Reactions   ? Pcn [Penicillins] UNKNOWN     Pt received cefazolin 11/2018 and 11/2012        Medications  Scheduled Meds:allopurinoL (ZYLOPRIM) tablet 100 mg, 100 mg, Oral, QHS  benztropine (COGENTIN) tablet 0.5 mg, 0.5 mg, Oral, BID  carbidopa-levodopa (SINEMET) 25-100 mg tablet 1.5 tablet, 1.5 tablet, Oral, TID (See eMAR)  methocarbamoL (ROBAXIN) tablet 1,000 mg, 1,000 mg, Oral, QID  rosuvastatin (CRESTOR) tablet 10 mg, 10 mg, Oral, QHS    Continuous Infusions:  PRN and Respiratory Meds:acetaminophen Q4H PRN, ondansetron (ZOFRAN) IV Q6H PRN, oxyCODONE Q4H PRN      Diagnostic Tests  Hematology:   Lab Results   Component Value Date    HGB 12.8 02/10/2023    HGB 12.3 12/05/2018    HCT 39.9 02/10/2023    HCT 39.6 12/05/2018    PLTCT 137 02/10/2023    PLTCT 189 12/05/2018    WBC 3.00 02/10/2023    WBC 14.0 12/05/2018    NEUT 84 12/01/2012    ANC 10.20 12/01/2012    ALC 1.20 12/01/2012    MONA 6 12/01/2012    AMC 0.70 12/01/2012    EOSA 0 12/01/2012    ABC 0.00 12/01/2012    MCV 79.2 02/10/2023    MCV 79.5 12/05/2018    MCH 25.4 02/10/2023    MCH 24.7 12/05/2018    MCHC 32.1 02/10/2023    MCHC 31.1 12/05/2018    MPV 8.9 02/10/2023    MPV 9.0 12/05/2018    RDW 13.4 02/10/2023    RDW 13.1 12/05/2018         General Chemistry:   Lab Results   Component Value Date    NA 139 02/10/2023    NA 140 12/05/2018    K 4.0 02/10/2023    K 4.1 12/05/2018    CL 102 02/10/2023    CL 105 12/05/2018    CO2 31 02/10/2023    CO2 25 12/05/2018    GAP 6 02/10/2023    GAP 10 12/05/2018    BUN 15 02/10/2023    BUN 14 12/05/2018    CR 1.22 02/10/2023    CR 1.05 12/05/2018    GLU 89 02/10/2023    GLU 149 12/05/2018    GLU 93  01/08/2003    CA 9.5 02/10/2023    CA 9.6 12/05/2018    MG 2.0 01/08/2003      Coagulation: No results found for: PT, PTT, INR      Follow-Up Assessment  Patient location during evaluation: floor      Anesthetic Complications:   Anesthetic complications: The patient did not experience any anesthestic complications.      Pain:  Score: 0    Management:adequate     Level of Consciousness: awake and alert   Hydration:acceptable     Airway Patency: patent   Respiratory Status: acceptable, room air and spontaneous ventilation     Cardiovascular Status:acceptable   Regional/Neuroaxial:

## 2023-03-03 NOTE — Care Plan
Problem: Discharge Planning  Goal: Participation in plan of care  Outcome: Goal Achieved  Goal: Knowledge regarding plan of care  Outcome: Goal Achieved  Goal: Prepared for discharge  Outcome: Goal Achieved     Problem: High Fall Risk  Goal: High Fall Risk  Outcome: Goal Achieved

## 2023-03-03 NOTE — Progress Notes
Mitchell Burnett discharged on 03/03/2023.   Marland Kitchen  Discharge instructions reviewed with patient.  Valuables returned: Yes  Belongings Identifier (i.e. Envelope #, Locker #, etc.): 49 number.  Home medications: None brought   .  Functional assessment at discharge complete: Yes .    Patient ambulated to wheelchair and was transported home by family.

## 2023-03-03 NOTE — Discharge Instructions - Supplementary Instructions
Mitchell Burnett    Bilateral Subthalamic Nucleus Deep Brain Stimulation Electrode Insertion, Insertion Pulse Generator on 03/02/2023 with Gwyndolyn Kaufman, MD    Neurosurgery Discharge Instructions    Contact information:  Call the Neurosurgery clinic if you have questions or are experiencing problems at [phone].   After 5 PM, weekends, holidays please call [phone] to reach Neurosurgery on call.  Post-operative wound care:  Your incision has dissolving sutures/glue in place.  Your incision may be open to air.  Keep your incision dry for 5 days. Shower neck down until 03/06/23.  Starting 03/07/23 use non-medicated soap to wash incision daily, pat dry and leave open to air.    Avoid heavy water pressure over incision site.  Only touch your incision with clean hands. Do not apply lotion, cream, or ointments to incision.  Do not submerge (pool/tub) incision under water at all for 4 weeks.  Have someone look at your incision every day. It should look the same or better each day.  Activity restrictions:  Avoid pushing, pulling, or lifting more than 10 pounds (about a gallon of milk). If you hold children, they should be placed in your lap or crawl into lap if old enough.   Do NOT drive until you are cleared by your physician.  Avoid bearing down or straining to have bowel movements.  You must be off all pain medication before driving restriction is released.  Walking is the best initial therapy for recovery.  Walk several times daily as tolerated.  Post-operative pain and medications:  Please use your pain medications and muscle relaxers as prescribed.    Do not take NSAIDS (Ibuprofen, Advil, Aleve, Motrin, Naproxen) until Doctor approved.  Pain medications can make you constipated.  Please take stool softeners to aid this process.  Tylenol is approved for pain control.  This is available over the counter.  Follow up appointment:  Scheduled appointments:      Mar 16, 2023 11:00 AM  Postoperative visit with Rayne Du, APRN-NP  Neurosurgery: [facility] (NeuroSurgery) [address]  [address], [address] [place] [address]  [phone]     Apr 06, 2023 2:00 PM  Procedure with Barbaraann Share, MD  Neurology: [facility] (Neurology) [address]  [place] [address]  [phone]     Apr 22, 2023 11:45 AM  Postoperative visit with Emogene Morgan, MD  Neurosurgery: [facility] (NeuroSurgery) [address]  [address], [address] [place] [address]  [phone]        Reasons to call the Neurosurgery Clinic [phone]):  Concerning lethargy (sleepiness), decreased level of consciousness, new confusion.  Worsening vision, facial weakness, tongue weakness, new hearing, or balance difficulties.  Fever 101 or greater.  Redness or swelling of incision.   Continuous oozing or persistent clear fluid coming from incision.  Headaches increasing in severity and occurrences.  A noticeable and increasing fluid collection developing under the skin, around your incision.   Intense pain that is getting worse or unrelieved by pain medications or muscle relaxers.

## 2023-03-04 ENCOUNTER — Encounter: Admit: 2023-03-04 | Discharge: 2023-03-04 | Payer: MEDICARE

## 2023-03-16 ENCOUNTER — Ambulatory Visit: Admit: 2023-03-16 | Discharge: 2023-03-17 | Payer: MEDICARE

## 2023-03-16 DIAGNOSIS — Z9689 Presence of other specified functional implants: Secondary | ICD-10-CM

## 2023-03-16 NOTE — Progress Notes
Subjective:       History of Present Illness  Mitchell Burnett is a 73 y.o. male with a PMH including Parkinson's disease.    Patient was last seen in clinic on 09/2022 to discuss surgical treatment options in regards to his Parkinsonian symptoms.   Indications, risks, and benefits to deep brain stimulation surgery was discussed. The patient expressed understanding and elected to proceed.  On 03/02/23, patient underwent bilateral STN DBS implantation with Dr. Lorel Monaco.     He presents today for his 2 week follow up via telehealth. Patient states he is doing well after surgery. He denies having any postsurgical pain or complications.   He has noticed some decreased appetite since being home, although drinking without difficulty.   We discussed wound care instructions and activity/lifting restrictions.   He denies having any other questions or concerns at this time.     Allergies as of 03/16/2023 - Reviewed 03/02/2023   Allergen Reaction Noted    Pcn [penicillins] UNKNOWN 03/30/2011       Patient Active Problem List    Diagnosis Date Noted    S/P deep brain stimulator placement 03/03/2023    History of Parkinson's disease 03/02/2023    Cognitive impairment 03/22/2022    Lumbar spondylosis 03/13/2019    Lumbar degenerative disc disease 03/13/2019    Planovalgus deformity of foot, acquired, left 12/04/2018    Lumbar canal stenosis 03/16/2017     Overview Note:     He has progressive problems with low back pain with neurogenic claudication. He recently has had ESI with some benefit.       Parkinson disease (HCC) 02/12/2015     Overview Note:     Between his last visit in 2014 and January 2017 he developed right hand bradykinesia and increased tone making the diagnosis of PD clear. He has experienced side effects, primarily severe headaches, nausea, vomiting, excessive sleepiness and hallucinations with various dopaminergic agents including CD/LD, Azilect, Neurpro and ropinirole.     He remains incredibly active and this has been the best treatment for him.        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     Overview Note:     He has a chronic complaint of shoulder pain at night      Cervical radiculopathy 03/30/2011     Overview Note:     Fusion with cadaveric bone          Past Medical History:    Abnormal involuntary movement    Arthritis    BPH (benign prostatic hyperplasia)    Cataract    HLD (hyperlipidemia)    Hypertension    Movement disorder    Parkinson disease (HCC)    Vision decreased       Surgical History:   Procedure Laterality Date    SHOULDER SURGERY  1983    Right- dislocated    CERVICAL SPINE SURGERY  2009    SHOULDER REPLACEMENT Right 2012    LEFT COMPLEX TRIPLE ARTHRODESIS Left 12/04/2018    Performed by Ree Shay, MD at [facility] OR    LEFT FIRST TARSOMETATARSAL ARTHRODESIS Left 12/04/2018    Performed by Ree Shay, MD at [facility] OR    LEFT LATERAL ANLKE LIGAMENT Left 12/04/2018    Performed by Ree Shay, MD at [facility] [place]    LEFT ACHILLES TENDON LENGTHENING  Left 12/04/2018    Performed by Ree Shay, MD at [facility] OR    LEFT  FIRST  METATARSOPHALANGEAL JOINT RTS IMPLANT Left 12/04/2018    Performed by Ree Shay, MD at [facility] OR    CENTRALIZATION TIBIALIS ANTERIOR TENDON Left 12/04/2018    Performed by Ree Shay, MD at [facility] OR    ARTHRODESIS LEFT GREAT TOE - INTERPHALANGEAL JOINT Left 05/14/2019    Performed by Rayford Halsted, MD at [facility] OR    LEFT OPEN FLEXOR HALLUCIS TENOTOMY Left 05/14/2019    Performed by Rayford Halsted, MD at [facility] OR    CATARACT REMOVAL Bilateral 01/2020    Bilateral Subthalmic Nucleus Deep Brain Stimulation electrode insertion Bilateral 03/02/2023    Performed by Emogene Morgan, MD at [facility] OR    TWIST DRILL/ BURR HOLE/ CRANIOTOMY/ CRANIECTOMY WITH STEREOTACTIC IMPLANTATION NEUROSTIMULATOR ELECTRODE ARRAY WITHOUT INTRAOPERATIVE MICROELCTRODE RECORDING - EACH ADDITIONAL ARRAY N/A 03/02/2023    Performed by Emogene Morgan, MD at [facility] OR    INSERTION OR REPLACEMENT, PULSE GENERATOR OR RECEIVER, NEUROSTIMULATOR, CRANIAL, WITH 2 OR MORE ELECTRODE ARRAYS N/A 03/02/2023    Performed by Emogene Morgan, MD at [facility] OR    COLONOSCOPY      HX TURP         Family History   Problem Relation Name Age of Onset    Cancer Mother      Dementia Sister      Cancer Brother      Diabetes Neg Hx      Gout Neg Hx      SLE Neg Hx      Stroke Neg Hx      Psoriasis Neg Hx      Heart Disease Neg Hx      Kidney Disease Neg Hx      Parkinson's  Neg Hx      Tremor Neg Hx                Review of Systems   All other systems reviewed and are negative.        Objective:          acetaminophen (TYLENOL) 325 mg tablet Take two tablets by mouth every 4 hours as needed. Indications: pain    allopurinoL (ZYLOPRIM) 100 mg tablet Take one tablet by mouth at bedtime daily. Take with food.    Apple Cider Vinegar 300 mg tab Take one tablet by mouth daily as needed.    benztropine (COGENTIN) 0.5 mg tablet TAKE 1 TABLET BY MOUTH TWICE DAILY FOR  TREMOR    calcium carbonate (TUMS) 500 mg (200 mg elemental calcium) chewable tablet Chew one tablet by mouth daily as needed.    carbidopa-levodopa (SINEMET) 25-100 mg tablet One and 1/2  pill three times a day. 7 am, 11 am and between 4 and 5 pm    celecoxib (CELEBREX) 200 mg capsule Take one capsule by mouth daily. Indications: pain    oxyCODONE (ROXICODONE) 5 mg tablet Take one tablet by mouth every 4 hours as needed. Indications: pain    rosuvastatin (CRESTOR) 10 mg tablet Take one tablet by mouth at bedtime daily.     There were no vitals filed for this visit.  There is no height or weight on file to calculate BMI.     CBC w diff    Lab Results   Component Value Date/Time    WBC 3.00 (L) 02/10/2023 11:26 AM    RBC 5.03 02/10/2023 11:26 AM    HGB 12.8 (L) 02/10/2023 11:26 AM    HCT 39.9 (L) 02/10/2023 11:26 AM  MCV 79.2 (L) 02/10/2023 11:26 AM    MCH 25.4 (L) 02/10/2023 11:26 AM    MCHC 32.1 02/10/2023 11:26 AM    RDW 13.4 02/10/2023 11:26 AM    PLTCT 137 (L) 02/10/2023 11:26 AM    MPV 8.9 02/10/2023 11:26 AM    Lab Results   Component Value Date/Time    NEUT 84 (H) 12/01/2012 09:04 AM    ANC 10.20 (H) 12/01/2012 09:04 AM    LYMA 10 (L) 12/01/2012 09:04 AM    ALC 1.20 12/01/2012 09:04 AM    MONA 6 12/01/2012 09:04 AM    AMC 0.70 12/01/2012 09:04 AM    EOSA 0 12/01/2012 09:04 AM    AEC 0.00 12/01/2012 09:04 AM    BASA 0 12/01/2012 09:04 AM    ABC 0.00 12/01/2012 09:04 AM        Comprehensive Metabolic Profile    Lab Results   Component Value Date/Time    NA 139 02/10/2023 11:26 AM    K 4.0 02/10/2023 11:26 AM    CL 102 02/10/2023 11:26 AM    CO2 31 (H) 02/10/2023 11:26 AM    GAP 6 02/10/2023 11:26 AM    BUN 15 02/10/2023 11:26 AM    CR 1.22 02/10/2023 11:26 AM    GLU 89 02/10/2023 11:26 AM    Lab Results   Component Value Date/Time    CA 9.5 02/10/2023 11:26 AM    GFR >60 02/10/2023 11:26 AM    GFRAA >60 12/05/2018 03:00 AM            Physical Exam  Neurological:      Mental Status: He is alert and oriented to person, place, and time.   Psychiatric:         Mood and Affect: Mood normal.         Behavior: Behavior normal.         Thought Content: Thought content normal.         Judgment: Judgment normal.       Bilateral cranial incisions: appears to be healing well. Some residual absorbable sutures are intact. There is no presence of active drainage, erythema, or wound separation noted.        Assessment and Plan:       Patient presents today for follow up, status post bilateral STN DBS implanation with Dr. Lorel Monaco on 03/02/23    Incision is  clean, dry, and intact. No presence of erythema, drainage, swelling noted  Call if temperature greater than 100.99F, incision red, drainage or odor noted from incision, pain that is uncontrolled with pain medication or any questions/concerns.  Avoid touching your incision as much as possible. No ointment, creams, or lotion on incisions.   Discussed ongoing wound care. Please continue with your current wound regimen    No driving for two weeks post surgery and while on any pain medications.   No straining while trying to have a bowel movement. Keep hydrated and use stool softeners as needed.   Avoid pulling, pushing or lifting greater than 10 pounds.    Please follow up with the neurology clinic on 04/06/23 for DBS activation     Please follow up with Dr. Lorel Monaco on 04/22/23 as previously scheduled.   All questions answered                       Total Time Today was 18 minutes in the following activities: Preparing to see the patient, Obtaining and/or reviewing  separately obtained history, Performing a medically appropriate examination and/or evaluation, and Counseling and educating the patient/family/caregiver     Rayne Du, APRN-NP  Phone: [phone]  Pager: [phone]

## 2023-04-05 ENCOUNTER — Encounter: Admit: 2023-04-05 | Discharge: 2023-04-05 | Payer: MEDICARE

## 2023-04-05 NOTE — Telephone Encounter
 Called pt and spoke with him and [name], pt's wife. Instructed to hold carbidopa/levodopa IR 25/100 mg for 12 hours before activation, so 2 am on 04/06/23. Encouraged to bring script with them. Answered all other questions.

## 2023-04-06 ENCOUNTER — Encounter: Admit: 2023-04-06 | Discharge: 2023-04-06 | Payer: MEDICARE

## 2023-04-06 NOTE — Telephone Encounter
 Dr. Genevieve Norlander was OOO on 04/06/23, requiring a reschedule for pt's.     Called pt and rescheduled for 04/08/23 at 8:30 am.

## 2023-04-08 ENCOUNTER — Encounter: Admit: 2023-04-08 | Discharge: 2023-04-08 | Payer: MEDICARE

## 2023-04-08 ENCOUNTER — Ambulatory Visit: Admit: 2023-04-08 | Discharge: 2023-04-09 | Payer: MEDICARE

## 2023-04-08 DIAGNOSIS — Z9689 Presence of other specified functional implants: Secondary | ICD-10-CM

## 2023-04-08 DIAGNOSIS — G20B2 Parkinson's disease with dyskinesia and fluctuating manifestations (HCC): Secondary | ICD-10-CM

## 2023-04-08 DIAGNOSIS — R4189 Other symptoms and signs involving cognitive functions and awareness: Secondary | ICD-10-CM

## 2023-04-08 NOTE — Progress Notes
 Reason for visit:  Mitchell Burnett is 73 y.o. 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 Accompanied by: wife and son who also aids in providing history.    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 He initially saw Dr. Hetty Ely in 2013 but was diagnosed with PD in 2017 after he developed right sided bradykinesia and rigidity. He was placed on carbidopa/levodopa. He also is on benztropine.    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 Despite this we discussed moving forward with bilateral STN DBS. He had bilateral STN DBS on 03/02/23 by Dr. Lorel Monaco.    Plan at the last visit:  - Neuropsychology testing pending in April  - Continue with plan for bilateral STN DBS [facility] DBS system)  - Continue carbidopa/levodopa IR 25/100mg  1.5 tablets TID  - Continue benztropine 0.5mg  BID    Post-surgery, the patient experienced confusion for a couple of days, which resolved. The patient noticed an initial improvement in tremors, which then returned but not to the pre-surgery level. The patient continues to experience balance issues, which have been ongoing and did not worsen post-surgery. The patient is currently on carbidopa levodopa and benztropine for tremor control. The patient expressed a desire to discontinue benztropine in the future.      Current medications related to present condition:    7:30-8am 12pm 10pm       CD/LD IR 25/100 mg  1.5 1.5 1.5       Benztropine 0.5mg  BID    Prior Medications:  Donepezil - stopped 10/18/22 due to insomnia     Medications:   Current Outpatient Medications on File Prior to Visit   Medication Sig Dispense Refill    acetaminophen (TYLENOL) 325 mg tablet Take two tablets by mouth every 4 hours as needed. Indications: pain      allopurinoL (ZYLOPRIM) 100 mg tablet Take one tablet by mouth at bedtime daily. Take with food.      Apple Cider Vinegar 300 mg tab Take one tablet by mouth daily as needed.      benztropine (COGENTIN) 0.5 mg tablet TAKE 1 TABLET BY MOUTH TWICE DAILY FOR  TREMOR 180 tablet 3    calcium carbonate (TUMS) 500 mg (200 mg elemental calcium) chewable tablet Chew one tablet by mouth daily as needed.      carbidopa-levodopa (SINEMET) 25-100 mg tablet One and 1/2  pill three times a day. 7 am, 11 am and between 4 and 5 pm 405 tablet 11    celecoxib (CELEBREX) 200 mg capsule Take one capsule by mouth daily. Indications: pain      oxyCODONE (ROXICODONE) 5 mg tablet Take one tablet by mouth every 4 hours as needed. Indications: pain 10 tablet 0    rosuvastatin (CRESTOR) 10 mg tablet Take one tablet by mouth at bedtime daily.       No current facility-administered medications on file prior to visit.       Past Medical History:    Past Medical History:    Abnormal involuntary movement    Arthritis    BPH (benign prostatic hyperplasia)    Cataract    HLD (hyperlipidemia)    Hypertension    Movement disorder    Parkinson disease (HCC)    Vision decreased        Social  History:    Social History     Socioeconomic History    Marital status: Married   Tobacco Use    Smoking status: Never    Smokeless tobacco: Never   Vaping Use    Vaping status: Never Used   Substance and Sexual Activity    Alcohol use: Yes     Alcohol/week: 3.0 standard drinks of alcohol     Types: 3 Cans of beer per week     Comment: 2-3 drinks per week    Drug use: No       Family History:   Family History   Problem Relation Name Age of Onset    Cancer Mother      Dementia Sister      Cancer Brother      Diabetes Neg Hx      Gout Neg Hx      SLE Neg Hx      Stroke Neg Hx      Psoriasis Neg Hx      Heart Disease Neg Hx      Kidney Disease Neg Hx      Parkinson's  Neg Hx      Tremor Neg Hx          Allergies:   Allergies   Allergen Reactions    Pcn [Penicillins] UNKNOWN     Pt received cefazolin 11/2018 and 11/2012    Penicillin UNKNOWN         PHYSICAL EXAMINATION:      VITAL SIGNS:   Vitals:    04/08/23 0839   BP: (!) 160/80   Pulse: 59       The last dosage of medication was 12 hours ago.    NEUROLOGICAL EXAM:    Mental Status: Alert and oriented x 3, fluent speech, comprehension intact, normal attention, adequate fund of knowledge, remote and short term memory intact    Cranial Nerves:  III, IV, VI: EOMI  VII: face symmetric  IX, X: uvula/palate midline  XI: SCM and trapezius 5/5, shoulder shrug symmetric  XII: tongue midline    Motor:  Bulk: Normal bulk throughout  Tone: As per UPDRS below      Movement Examination:  No dyskinesia  No dystonia  See UPDRS below    Gait:  Moderately decreased stride length and Normal stance width.  Reduced left and Reduced right arm swing.  Left and Right re-emergent upper extremity tremor.    UPDRS Part III Motor Examination:    Speech: 2 - Monotone, slurred but understandable- moderately impaired.  Facial Expression: 2 - Slight but definitely abnormal diminution of facial expression.  Tremor at Rest Jaw: 0 - Absent.  Tremor at Rest RUE: 2 - Moderate in amplitude, but only intermittently present.  Tremor at Rest LUE: 2 - Moderate in amplitude, but only intermittently present.  Tremor at Rest RLE: 0 - Absent  Tremor at Rest LLE: 0 - Absent  Action of Postural Tremor of Hands R: 1 - Slight and infrequent.  Action of Postural Tremor of Hands L: 0 - Absent  Rigidity NECK: 2 - Mild to moderate.  Rigidity RUE: 2 - Mild to moderate.  Rigidity LUE: 2 - Mild to moderate.  Rigidity RLE: 2 - Mild to moderate  Rigidity LLE: 2 - Mild to moderate.  Finger Taps R: 3 -  Severely impaired. Frequent hesitation in initiating movement or arrests in ongoing movement. (3-6/5 sec)  Finger Taps L: 3 - Severely impaired. Frequent hesitation in initiating  movement or arrests in ongoing movement. (3-6/5 sec)  Hand Movements R: 2 - Moderately impaired. Definite and early fatiguing. May have occasional arrests in movement.  Hand Movements L: 1 - Mild slowing and/or reduction in amplitude.  Rapid Alternating Movement of Hands R: 2 - Moderately impaired. Definite and early fatiguing. May have occasional arrests in movement.  Rapid Alternating Movements of Hands L: 1 - Mild slowing and/or reduction in amplitude.  Leg Agility with Knee Bent R: 2 - Moderately impaired. Definite and early fatiguing.  Leg Agility with Knee Bent L: 2 - Moderately impaired. Definite and early fatiguing.  Arising From Chair: 1 - Slow, or may need more than one attempt.  Posture: 2 - Moderately stooped posture, definitely abnormal- can be slightly to one side.  Gait: 2 - Walks with difficulty, but requires little or no assistance- may have some festination short steps, or propulsion.  Postural Stability: 0 - Normal  Body Bradykinesia and Hypokinesia: 2 - Mild degree of slowness and poverty of movement which is definitely abnormal. Alternatively, some reduced amplitude.  Total Motor Exam: 42    DBS Monopolar Review:  Device: [employer]  Target: Bilateral STN DBS  Lead placement date: 03/02/23  Last battery placement: 03/02/23  Date of Monopolar Review: 04/08/2023    Summary:  Amplitude: [place] (mA)  Pulse width: 60 microseconds  Frequency: 130 Hz  Case positive    Left STN  Contact: 1-  Adverse effect threshold: 2.5  Adverse effect: Sensory/paresthesias, right arm and hand  Contact: 2-  Adverse effect threshold: 3.0  Adverse effect: Sensory/paresthesias, right face and arm  Contact: 3-  Adverse effect threshold: 3.5  Adverse effect: Sensory/paresthesias, right face  Contact: 4-  Adverse effect threshold: 3.5  Adverse effect: Sensory/paresthesias, right side    1-  0.5-2.0 - transient hand paresthesias  2.0 - improved tremor and bradykinesia  2.5 - persistent hand and arm paresthesias    2-  0.5 - no side effects  1.0-2.0 - transient hand paresthesias  2.0 - improved tremor and bradykinesia  2.5 - transient right face and arm paresthesias  3.0 - persistent right side paresthesias    3-  0.5-2.0 - no side effects  2.0 - improved tremor  2.5-3.0 - right face paresthesias, transient  3.5 - persistent right face paresthesias    4-  0.5-2.0 - no side effects  2.0 - ongoing tremor  2.5-3.0 - no side effects  3.5 - right sided paresthesias, persistent      Right STN  Contact: 1-  Adverse effect threshold: 1.5  Adverse effect: Sensory/paresthesias, left hand  Contact: 2-  Adverse effect threshold: 2.0  Adverse effect: Sensory/paresthesias, left hand  Contact: 3-  Adverse effect threshold: 2.5  Adverse effect: Sensory/paresthesias, left hand and face  Contact: 4-  Adverse effect threshold: 3.5  Adverse effect: Sensory/paresthesias, left hand and face    1-  0.5-1.0 - left hand paresthesias, transient  1.5 - left hand paresthesias, persistent, improved tremor    2-  0.5-1.0 - no side effects  1.5 - left hand paresthesias, transient, improved trmeor  2.0 - left hand paresthesias, persistent    3-  0.5-1.0 - no side effects  1.5 - ongoing tremor, no side effects  2.0 - no side effects, improved tremor  2.5 - left hand and face paresthesias    4-  0.5-1.5 - no side effects  1.5 - ongoing tremor  2.0 - no side effects, mildly improved tremor  2.5-3.0 - transient  left hand paresthesias  3.5 - left hand and face paresthesias    DBS settings and usage:    Program 1:     - Final and safe setting  Left STN: 1- C+ (0-1.59mA) 1.66mA PW 60 Freq 130  Right STN: 1- C+ (0-1.46mA) 1.91mA PW 60 Freq 130    Program 2:  Left STN: 2- C+ (0-2.55mA) 1.71mA PW 60 Freq 130  Right STN: 2- C+ (0-1.18mA) 1.79mA PW 60 Freq 130    Program 3:  Left STN: 3- C+ (0-2.24mA) 1.4mA PW 60 Freq 130  Right STN: 3- C+ (0-2.40mA) 1.4mA PW 60 Freq 130    Assessment/Plan:    Mitchell Burnett is a 73 y.o. 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 He has done well with DBS monopolar review / initial DBS activation. He has expected thresholds and good tremor control on both sides. I suspect he will do well. I suggested lowering his carbidopa/levodopa IR 25/100mg  to 1 tablet TID. He will monitor for increased dyskinesias as we now restart his medications with the stimulation. If this occurs he knows to shut off the DBS or lower the settings.    In the future we will consider lowering and stopping his benztropine which may help his cognition as well.    We discussed Parkinson's disease in detail, including management options and prognosis. I discussed the importance of exercise and advised regular physical activity and further education on this.      Problem List Items Addressed This Visit          NEURO    Parkinson disease (HCC) - Primary    Cognitive impairment    S/P deep brain stimulator placement     - New DBS settings as above  - Reduce carbidopa/levodopa IR 25/100mg  1 tablets TID  - Continue benztropine 0.5mg  BID for now    To return to clinic 2-3 months for additional DBS programming.       Neurostimulator Services charges:  Total Programming Time:  58 minutes  95983 - eval of stimulator with programming - face to face first 15 minutes   95984 - eval of stimulator with programming - face to face each additional 15 minutes    53 - 67 minutes - 95983 x 1 + [id number] x 3    Arrielle Mcginn Loong Kelvin Lorenza Shakir, MD, FRCPC  [facility]  [facility]

## 2023-05-03 ENCOUNTER — Encounter: Admit: 2023-05-03 | Discharge: 2023-05-03 | Payer: MEDICARE

## 2023-05-20 ENCOUNTER — Encounter: Admit: 2023-05-20 | Discharge: 2023-05-20 | Payer: MEDICARE

## 2023-05-20 ENCOUNTER — Ambulatory Visit: Admit: 2023-05-20 | Discharge: 2023-05-21 | Payer: MEDICARE

## 2023-05-20 ENCOUNTER — Encounter: Admit: 2023-05-20 | Discharge: 2023-05-20

## 2023-05-20 DIAGNOSIS — Z9689 Presence of other specified functional implants: Secondary | ICD-10-CM

## 2023-05-20 NOTE — Patient Instructions
 It was nice to see you today.  Thank you for choosing to visit our clinic.  Your time is important, and if you had to wait today, we do apologize.  Our goal is to run exactly on time.  However, on occasion, we get behind in clinic due to unexpected patient issues.  Thank you for your patience.     General Instructions:  Scheduling:  Our scheduling phone number is [phone] option #1.   How to reach our office:  Please send a MyChart message to Neurosurgery or leave a voicemail for the nurse, Elane Fritz [phone].  How to get a medication refill:  Please use the MyChart Refill request or contact your pharmacy directly to request medication refills.  Please allow 72 business hours for request to be completed.    Support for many chronic illnesses is available through [employer] at [email or web address] or [phone].    For help with MyChart:  please call [phone].    For questions on nights, weekends or holidays:  call the Operator at [phone], and ask for the doctor on call for Neurosurgery.    For more information on spinal conditions:  please visit [email or web address]   Our office fax number is [phone]     Again, thank you for coming in today.

## 2023-05-20 NOTE — Progress Notes
 HPI:  I had the pleasure of seeing Mitchell Burnett in the office today.    He is a 73 year old with Parkinson's disease who presents for a post-operative visit. He is accompanied by his caregiver.    He recently underwent bilateral STN DBS electrode insertion and has experienced good control of his Parkinson's symptoms following recent programming, with notable improvement in tremor and stiffness. However, he experiences confusion, which he attributes to anesthesia, surgery stress, and adjustments in medication.    He expresses concern about the DBS system, questioning its functionality since it is not visibly connected or charged daily. He reports improvement in symptoms with the current program settings.    He has reduced his carbidopa -levodopa  intake due to feeling 'off' when using the machine and taking the full dose. He continues to take the medication but at a reduced dose, noting that complete cessation is uncommon.    He experiences changes in sleep patterns, including waking up frequently at night and having vivid dreams, particularly on higher program settings. He sometimes wakes up disoriented, thinking he needs to go to work, despite being retired for many years. He takes a low dose of melatonin to aid sleep.    He reports stumbling when standing or starting to walk, but he is usually able to catch himself. He notes that his shaking is minimal unless he is nervous or excited. He is considering physical therapy to improve balance and gait.              Exam:  AA  Conversant  Speech fluent  MAEW with good strength  CN II-XII GI  Sensation GI  Incision well healed                       Plan:      Postoperative management of bilateral STN DBS for Parkinson's disease  Status post bilateral STN DBS electrode insertion with recent programming. Good control of Parkinson's symptoms. Experiencing confusion likely due to anesthesia, surgery stress, and medication adjustments. Addressed DBS system concerns, emphasizing 'set it and forget it' approach. Current medication includes carbidopa -levodopa , with dosage adjustments planned as DBS settings are optimized. Sleep disturbances and confusion expected to improve with adjustments. Uncommon to discontinue medication post-DBS; adjustments necessary for optimal balance.  - Continue current carbidopa -levodopa  regimen.  - Monitor and adjust medication dosage as DBS settings are optimized.  - Educated on DBS system charging and maintenance, ensuring understanding of process and frequency.  - Provided reassurance regarding DBS system functioning, emphasizing benefits of minimal intervention.  - Encourage monitoring of falls and balance issues.  - Discuss potential for physical therapy to improve balance and gait.    Balance and gait issues  Experiencing balance and gait issues, likely related to Parkinson's disease and adjustments in medication and DBS settings. Improvement in compensatory mechanisms noted. Physical therapy expected to aid in improving balance and gait.  - Provide script for outpatient physical therapy for gait and balance training in [place].  - Encourage safe practices to prevent falls.  - Monitor and report significant changes in balance or gait.    Sleep disturbances  Experiencing sleep disturbances, likely related to medication and DBS adjustments. Currently taking low-dose melatonin. Sleep quality expected to improve with adjustments. Physical therapy may aid sleep by enhancing physical condition.  - Monitor sleep patterns and discuss with neurologist if issues persist.  - Consider physical therapy to improve overall physical condition and potentially aid sleep.  I spent 20 minutes in face to face contact. Over 50% of that time was spent in education and counseling. At the conclusion of our discussion she indicated that there were no further questions at this time.    ATTESTATION  This visit was documented by [facility] via audio recorded by Allean Island, MD, on 05/20/2023. If you note mistakes or inaccurate information please feel free to reach out to ensure accuracy.    Allean Island, MD  Assistant Professor, Department of Neurosurgery  [facility]

## 2023-05-31 ENCOUNTER — Encounter: Admit: 2023-05-31 | Discharge: 2023-05-31 | Payer: MEDICARE

## 2023-05-31 NOTE — Progress Notes
 PT order faxed to [facility] @ [phone] per patient request

## 2023-06-08 ENCOUNTER — Encounter: Admit: 2023-06-08 | Discharge: 2023-06-08 | Payer: MEDICARE

## 2023-06-08 ENCOUNTER — Ambulatory Visit: Admit: 2023-06-08 | Discharge: 2023-06-09 | Payer: MEDICARE

## 2023-06-08 DIAGNOSIS — Z9689 Presence of other specified functional implants: Secondary | ICD-10-CM

## 2023-06-08 DIAGNOSIS — G20B1 Parkinson's disease with dyskinesia without fluctuating manifestations (CMS-HCC): Secondary | ICD-10-CM

## 2023-06-08 MED ORDER — CARBIDOPA-LEVODOPA 25-100 MG PO TAB
1 | ORAL_TABLET | Freq: Three times a day (TID) | ORAL | 3 refills | 30.00000 days | Status: DC
Start: 2023-06-08 — End: 2023-06-08

## 2023-06-08 MED ORDER — CARBIDOPA-LEVODOPA 25-100 MG PO TAB
1 | ORAL_TABLET | Freq: Three times a day (TID) | ORAL | 3 refills | 30.00000 days | Status: AC
Start: 2023-06-08 — End: ?

## 2023-06-08 NOTE — Progress Notes
 Reason for visit:  Mitchell Burnett is 73 y.o. 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 Accompanied by: wife and son who also aids in providing history.    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 He initially saw Dr. Auston Blush in 2013 but was diagnosed with PD in 2017 after he developed right sided bradykinesia and rigidity. He was placed on carbidopa/levodopa. He also is on benztropine.    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 Despite this we discussed moving forward with bilateral STN DBS. He had bilateral STN DBS on 03/02/23 by Dr. Alene Husk.    Plan at the last visit:  - New DBS settings as above  - Reduce carbidopa/levodopa IR 25/100mg  1 tablets TID  - Continue benztropine 0.5mg  BID for now    Since his last visit he's had no major changes. Tried all three programs for about a week. Program 1 works the best. Program 2 is good but not as effective as program 1 and program three caused him to act strangely at night, he got combative and confused. They feel that once the program is changed he gets a better response and then about half way through the week some of those benefits start to fade a little, but still better than pre surgical levels. He continues to have some trouble with freezing of gait and frequently loses his balance. Currently in the process of setting up PT for his balance and walking. He had no difficulties in reducing his sinemet since the last visit.     DBS helps 60-70%. He has ongoing non-bothersome mouth dyskinesias that he rarely notices.    Current medications related to present condition:    7:30-8am 12pm 10pm       CD/LD IR 25/100 mg  1 1 1        Benztropine 0.5mg  BID    Prior Medications:  Donepezil - stopped 10/18/22 due to insomnia     Medications:   Current Outpatient Medications on File Prior to Visit   Medication Sig Dispense Refill    acetaminophen (TYLENOL) 325 mg tablet Take two tablets by mouth every 4 hours as needed. Indications: pain      allopurinoL (ZYLOPRIM) 100 mg tablet Take one tablet by mouth at bedtime daily. Take with food.      Apple Cider Vinegar 300 mg tab Take one tablet by mouth daily as needed.      benztropine (COGENTIN) 0.5 mg tablet TAKE 1 TABLET BY MOUTH TWICE DAILY FOR  TREMOR 180 tablet 3    calcium carbonate (TUMS) 500 mg (200 mg elemental calcium) chewable tablet Chew one tablet by mouth daily as needed.      rosuvastatin (CRESTOR) 10 mg tablet Take one tablet by mouth at bedtime daily.       No current facility-administered medications on file prior to visit.       Past Medical History:    Past Medical History:    Abnormal involuntary movement    Arthritis    BPH (benign prostatic hyperplasia)    Cataract    HLD (hyperlipidemia)    Hypertension    Movement disorder    Parkinson disease (CMS-HCC)    Vision decreased        Social History:    Social History  Socioeconomic History    Marital status: Married   Tobacco Use    Smoking status: Never    Smokeless tobacco: Never   Vaping Use    Vaping status: Never Used   Substance and Sexual Activity    Alcohol use: Yes     Alcohol/week: 3.0 standard drinks of alcohol     Types: 3 Cans of beer per week     Comment: 2-3 drinks per week    Drug use: No       Family History:   Family History   Problem Relation Name Age of Onset    Cancer Mother      Dementia Sister      Cancer Brother      Diabetes Neg Hx      Gout Neg Hx      SLE Neg Hx      Stroke Neg Hx      Psoriasis Neg Hx      Heart Disease Neg Hx      Kidney Disease Neg Hx      Parkinson's  Neg Hx      Tremor Neg Hx          Allergies:   Allergies   Allergen Reactions    Pcn [Penicillins] UNKNOWN     Pt received cefazolin 11/2018 and 11/2012    Penicillin UNKNOWN         PHYSICAL EXAMINATION:      VITAL SIGNS:   Vitals:    06/08/23 0900 06/08/23 0903   BP: (!) 135/94 114/72   BP Source: Arm, Left Upper Arm, Left Upper   Patient Position: Sitting Standing   Pulse:  76         The last dosage of medication was 2hr hours ago.    NEUROLOGICAL EXAM:    Mental Status: Alert and oriented x 3, fluent speech, comprehension intact, normal attention, adequate fund of knowledge, remote and short term memory intact    Cranial Nerves:  III, IV, VI: EOMI  VII: face symmetric  IX, X: uvula/palate midline  XI: SCM and trapezius 5/5, shoulder shrug symmetric  XII: tongue midline    Motor:  Bulk: Normal bulk throughout  Tone: As per UPDRS below      Movement Examination:  No dyskinesia  No dystonia  See UPDRS below    Gait:  Moderately decreased stride length and Normal stance width.  Reduced left and Reduced right arm swing.  Left and Right re-emergent upper extremity tremor.    UPDRS Part III Motor Examination:    Speech: 1 - Slight loss of expression, diction and/or volume.  Facial Expression: 2 - Slight but definitely abnormal diminution of facial expression.  Tremor at Rest Jaw: 0 - Absent.  Tremor at Rest RUE: 2 - Moderate in amplitude, but only intermittently present.  Tremor at Rest LUE: 2 - Moderate in amplitude, but only intermittently present.  Tremor at Rest RLE: 1 - Slight and infrequently present.  Tremor at Rest LLE: 1 - Slight and infrequently present.  Action of Postural Tremor of Hands R: 1 - Slight and infrequent.  Action of Postural Tremor of Hands L: 0 - Absent  Rigidity NECK: 2 - Mild to moderate.  Rigidity RUE: 2 - Mild to moderate.  Rigidity LUE: 2 - Mild to moderate.  Rigidity RLE: 2 - Mild to moderate  Rigidity LLE: 2 - Mild to moderate.  Finger Taps R: 3 -  Severely impaired. Frequent hesitation in initiating  movement or arrests in ongoing movement. (3-6/5 sec)  Finger Taps L: 2 - Moderately impaired. Definite and early fatiguing. May have occasional arrests in movement. (7-10/5 sec)  Hand Movements R: 3 - Severely impaired. Frequent hesitation in initiating movement or arrests in ongoing movement.  Hand Movements L: 1 - Mild slowing and/or reduction in amplitude.  Rapid Alternating Movement of Hands R: 1 - Mild slowing and/or reduction in amplitude.  Rapid Alternating Movements of Hands L: 0 - Normal  Leg Agility with Burnett Bent R: 1 - Mild slowing and/or reduction in amplitude.  Leg Agility with Burnett Bent L: 1 - Mild slowing and/or reduction in amplitude.  Arising From Chair: 1 - Slow, or may need more than one attempt.  Body Bradykinesia and Hypokinesia: 2 - Mild degree of slowness and poverty of movement which is definitely abnormal. Alternatively, some reduced amplitude.    DBS Monopolar Review:  Device: [employer]  Target: Bilateral STN DBS  Lead placement date: 03/02/23  Last battery placement: 03/02/23  Date of Monopolar Review: 06/08/2023  Individual impedances were checked and were within normal limits.    DBS initial settings and usage:    Program 1:     - Initial setting  Left STN: 1- C+ (0-1.30mA) 1.24mA PW 60 Freq 130  Right STN: 1- C+ (0-1.109mA) 1.46mA PW 60 Freq 130    Program 2:  Left STN: 2- C+ (0-2.33mA) 1.48mA PW 60 Freq 130  Right STN: 2- C+ (0-1.61mA) 1.56mA PW 60 Freq 130    Program 3:  Left STN: 3- C+ (0-2.4mA) 1.37mA PW 60 Freq 130  Right STN: 3- C+ (0-2.20mA) 1.59mA PW 60 Freq 130    He felt program 1 was the best. Program 3 caused some weird dreams. Program 2 was not as effective as program 1.    DBS final settings and usage:    Program 1:     - Final and safe setting  Left STN: 1- C+ (0-1.62mA) 1.13mA PW 60 Freq 130  Right STN: 1- C+ (0-1.40mA) 1.23mA PW 60 Freq 130    Program 2:  Left STN: 1- C+ (0-2.26mA) 1.18mA PW 60 Freq 130  Right STN: 1- C+ (0-2.62mA) 1.54mA PW 60 Freq 130    Program 3:  Left STN: 1- C+ (0-2.97mA) 2.63mA PW 60 Freq 130  Right STN: 1- C+ (0-2.61mA) 2.30mA PW 60 Freq 130    DBS Programming Strategy:  Made DBS programs based off best program 1 to try.    Assessment/Plan:    Mitchell Burnett is a 74 y.o. 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 He has done well with DBS with 60-70% improvement. I have given him new DBS settings as above to try. He has mild mouth dyskinesias that we will monitor.    I suggested continuing carbidopa/levodopa IR 25/100mg  1 tablet TID but now that he has had improvement in tremor I suggested gradually weaning off benztropine.    We discussed Parkinson's disease in detail, including management options and prognosis. I discussed the importance of exercise and advised regular physical activity and further education on this.      Problem List Items Addressed This Visit          NEURO    Parkinson disease (CMS-HCC) - Primary    Relevant Medications    carbidopa-levodopa (SINEMET) 25-100 mg tablet    S/P deep brain stimulator placement     -  New DBS settings as above  - Continue carbidopa/levodopa IR 25/100mg  1 tablets TID  - Gradually wean benztropine and stop    To return to clinic 2-3 months for additional DBS programming.       Neurostimulator Services charges:  Total Programming Time:  15 minutes  95983 - eval of stimulator with programming - face to face first 15 minutes    8 to 22 minutes - [id number] x 1    Mitchell Nilsson Loong Geralyn Knee, MD, FRCPC  [facility]  [facility]

## 2023-06-16 ENCOUNTER — Encounter: Admit: 2023-06-16 | Discharge: 2023-06-16 | Payer: MEDICARE

## 2023-06-24 ENCOUNTER — Encounter: Admit: 2023-06-24 | Discharge: 2023-06-24 | Payer: MEDICARE

## 2023-06-24 NOTE — Telephone Encounter
 Received VM from patient's spouse requesting medications be transferred to [facility]. Requests call back.    Return PC to patient. Informed patient that Dr. Metro Acron sent carbidopa-levodopa (SINEMET) 25-100 mg tablet to [facility] on 06/08/23. Patient stated that they would call pharmacy at [phone] to check on status of prescription. Additionally, patient asking if they should continue to see Dr. Metro Acron and no longer see Dr. Auston Blush. Informed patient that Dr. Auston Blush referred him to Dr. Metro Acron due to PD dx as well as DBS implantation as that is his specialty. Patient & spouse verbalized understanding. No further questions at this time.

## 2023-07-11 ENCOUNTER — Encounter: Admit: 2023-07-11 | Discharge: 2023-07-11 | Payer: MEDICARE

## 2023-07-18 ENCOUNTER — Encounter: Admit: 2023-07-18 | Discharge: 2023-07-18 | Payer: MEDICARE

## 2023-07-18 NOTE — Telephone Encounter
 Verified 2 patient identifiers    This RN received a voicemail from patient's wife, Mitchell Burnett, stating that she had some questions for Dr. Chari Como nurse, Isa Manuel. This RN called patient back and was able to speak to patient and he stated that he spoke to their office and they made him an appointment for June 30th. This RN let patient know that the appointment was made for July 30th. Patient's wife then got on the phone and some additional questions about his medications. This RN transferred them to Dr. Chari Como nurse, Janessa. Patient was agreeable to this plan of care and had no further questions or concerns at this time.

## 2023-07-25 ENCOUNTER — Encounter: Admit: 2023-07-25 | Discharge: 2023-07-25 | Payer: MEDICARE

## 2023-07-27 ENCOUNTER — Encounter: Admit: 2023-07-27 | Discharge: 2023-07-27 | Payer: MEDICARE

## 2023-07-29 ENCOUNTER — Encounter: Admit: 2023-07-29 | Discharge: 2023-07-29 | Payer: MEDICARE

## 2023-07-29 ENCOUNTER — Ambulatory Visit: Admit: 2023-07-29 | Discharge: 2023-07-30 | Payer: MEDICARE

## 2023-08-01 ENCOUNTER — Encounter: Admit: 2023-08-01 | Discharge: 2023-08-01 | Payer: MEDICARE

## 2023-08-03 ENCOUNTER — Encounter: Admit: 2023-08-03 | Discharge: 2023-08-03 | Payer: MEDICARE

## 2023-08-18 ENCOUNTER — Encounter: Admit: 2023-08-18 | Discharge: 2023-08-18 | Payer: MEDICARE

## 2023-09-08 NOTE — Patient Instructions
 Thank you for choosing to let us  care for you today.  Please read through the following information that will help us  continue to provide the best care possible.     -- Preferred method of communication is through [employer], if the issue cannot wait until your next scheduled follow up.   -- MyChart may be used for non-emergent communication. Emails are not reviewed after hours or over the weekend/holidays/after 4PM. Staff will reply to your email within 24-48 business hours.       -- If you do not hear from us  within one week of a lab or imaging study being completed, please call/send my chart email to the office to be sure that we have received the results. This is especially challenging when tests are done outside of the [facility] system, as many times results do not make it back to our office for a variety of reasons. In our office no news is good news does not apply. You should hear from us  with results for each test.  [facility] [place] lab/imaging results:  Due to the CARES act, results automatically release to MyChart.  Dr. Mamie will continue to send you a result note on any labs that he orders.  With these changes you may see your results before Dr. Mamie does.   Please allow up to 72 hours for review and response to your results.     -- If you are having acute (new/sudden onset) or severe/worsening neurologic symptoms, please call 911 or seek care in ED.    -- For scheduling of IMAGING/RADIOLOGY, please call [phone] at your convenience to schedule your studies.  -- For referrals placed during the visit, if you have not heard from scheduling within one week, please call the call center at [phone] to get scheduling assistance.  -- For refills on medications, please first contact your pharmacy, who will fax a refill authorization request form to our office.  Weekdays only. Allow up to 2 business days for refills. Please plan ahead, as refills will not be filled after hours.    -- Our scheduling staff may be reached at [phone] opt. 1 for scheduling needs.     --Dr Laban nurse Delon may be contacted at [phone] for urgent needs. Staff will return your call within 24 business hours.     For Appointments:   -- Please try to arrive early for your appointment time to help facilitate your visit. 15 minutes early is recommended.   -- If you are late to your appointment, we reserve the right to ask you to reschedule or wait until next available time to be seen in fairness to other patients scheduled that day.   -- There are times when we are running behind in clinic. Our goal is to always be on time, however, there are time when unexpected events occur with patients, which may cause a delay. We appreciate your understanding when this occurs.

## 2023-09-28 ENCOUNTER — Ambulatory Visit: Admit: 2023-09-28 | Discharge: 2023-09-29 | Payer: MEDICARE

## 2023-09-28 ENCOUNTER — Encounter: Admit: 2023-09-28 | Discharge: 2023-09-28 | Payer: MEDICARE

## 2023-09-28 DIAGNOSIS — R4189 Other symptoms and signs involving cognitive functions and awareness: Secondary | ICD-10-CM

## 2023-09-28 DIAGNOSIS — Z9689 Presence of other specified functional implants: Secondary | ICD-10-CM

## 2023-09-28 DIAGNOSIS — G20A1 Parkinson's disease without dyskinesia or fluctuating manifestations (CMS-HCC): Principal | ICD-10-CM

## 2023-09-28 DIAGNOSIS — K117 Disturbances of salivary secretion: Principal | ICD-10-CM

## 2023-09-28 NOTE — Progress Notes
 Reason for visit:  Mitchell Burnett is 73 y.o. 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 Accompanied by: wife and son who also aids in providing history.    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 He initially saw Dr. Jacklynn in 2013 but was diagnosed with PD in 2017 after he developed right sided bradykinesia and rigidity. He was placed on carbidopa /levodopa . He was also on benztropine .    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 Despite this we discussed moving forward with bilateral STN DBS. He had bilateral STN DBS on 03/02/23 by Dr. Burke.    Plan at the last visit:  - New DBS settings as above  - Continue carbidopa /levodopa  IR 25/100mg  1 tablet at night  - Atropine 1% drops SL PRN    He presents with speech difficulties and hallucinations.    He experiences hallucinations, particularly at night, which have improved after discontinuing carbidopa  levodopa  and atropine drops, and starting melatonin 5 mg nightly.    Speech difficulties have increased and vary with the level of deep brain stimulation (DBS). Adjustments to DBS settings provide temporary improvement. Swallowing difficulties and significant drooling persist despite some relief from DBS adjustments.    Tremors worsen when DBS settings are lowered to improve speech, with more pronounced tremors on the left side, his dominant hand. Stiffness, especially in the upper body, fluctuates with DBS changes. He is experimenting with different DBS programs, with program two being the most consistent.      Current medications related to present condition:  Melatonin 5mg  qHS    Prior Medications:  Donepezil  - stopped 10/18/22 due to insomnia   Benztropine  0.5mg  BID - stopped after DBS improved tremor  carbidopa /levodopa  IR 25/100mg  1 tablet TID during the day makes him a zombie and cognitive difficulties  carbidopa /levodopa  IR 25/100mg  1 tablet at 10pm - stopped due to possible hallucinations.  Stopped atropine drops - possible hallucinations and confusion and also weren't effective          09/28/2023   Movement Disorder Questionnaire   Since last visit I am: Moderately worse (26-50%)   Memory Problems: None   Hallucinations/delusions: seeing, hearing, feeling or imagining things that are not there or not true: Slight. I feel someone is in the room, see shadows, or have images of things moving. It is not bothersome.   Depression: feeling sad, blue, hopeless, unable to enjoy things: Slight. Occurs rarely and is not bothersome.   Anxiety: nervous, worried, tearful or tense: Slight. Occurs in certain situations and does not interfere with daily activities.   Apathy: loss of interest, enthusaism, or concern: Slight. I have lost interest in a few hobbies or elective activities, but typical daily activities are not affected.   Gambling: None.   Shopping: None.   Sex: None.   Pornography: None.   Eating: Mild. Impulsive behaviors occur occasionally but have not caused a problem for me but may be a problem for my family.   Doing unnecessary things like emptying drawers/closets in bedroom/garage and leaving things in a mess: Slight. I have urges but I can control them.   Nighttime sleep: Marked. I have difficulty falling asleep, wake up multiple times, and have difficulty getting back to sleep.   Daytime sleepiness: Mild. I get sleepy for a few hours after taking  my medications.   Vivid dreams: dreams that are so clear they seem real. Slight. I have them occasionally, but they don?t bother me.   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 Slightly, rarely do I talk or yell in my sleep.   Restless legs syndrome: uncomfortable sensations in your legs that are uncontrollable and occur when resting, in the evenings, or when you get in bed. Slight, rare sensations occur, but they don?t bother me.   Pain or muscle cramps: None.   Urination: None.   Constipation: Slight. I am occasionally constipated, but I do not take any medications.   Dizziness or lightheadedness: None.   Tiredness/Fatigue: Slightly. I can finish all my tasks/activities, but my stamina is reduced, and I feel tired more quickly than I used to.   Falling: Mild. I fall no more than once a month.   Personal Care: Slight. I take care of myself, but several activities take me longer to complete.   Assistive devices for getting around: Mitchell Burnett   Are you taking any form of levodopa ? No              Medications:   Current Outpatient Medications on File Prior to Visit   Medication Sig Dispense Refill    acetaminophen  (TYLENOL ) 325 mg tablet Take two tablets by mouth every 4 hours as needed. Indications: pain      allopurinoL  (ZYLOPRIM ) 100 mg tablet Take one tablet by mouth at bedtime daily. Take with food.      Apple Cider Vinegar 300 mg tab Take one tablet by mouth daily as needed.      atropine (ISOPTO ATROPINE) 1 % ophthalmic solution Place two drops under tongue three times daily. For sialorrhea, I suggested atropine 1% ophthalmic solution to use 2-3 drops sublingually BID-TID. 15 mL 3    calcium carbonate (TUMS) 500 mg (200 mg elemental calcium) chewable tablet Chew one tablet by mouth daily as needed.      carbidopa -levodopa  (SINEMET ) 25-100 mg tablet Take one tablet by mouth three times daily. (Patient not taking: Reported on 09/28/2023) 270 tablet 3    rosuvastatin  (CRESTOR ) 10 mg tablet Take one tablet by mouth at bedtime daily.       No current facility-administered medications on file prior to visit.       Past Medical History:    Past Medical History:    Abnormal involuntary movement    Arthritis    BPH (benign prostatic hyperplasia)    Cataract    HLD (hyperlipidemia)    Hypertension    Movement disorder    Parkinson disease (CMS-HCC)    Vision decreased        Social History: Social History     Socioeconomic History    Marital status: Married   Tobacco Use    Smoking status: Never    Smokeless tobacco: Never   Vaping Use    Vaping status: Never Used   Substance and Sexual Activity    Alcohol use: Yes     Alcohol/week: 3.0 standard drinks of alcohol     Types: 3 Cans of beer per week     Comment: 2-3 drinks per week    Drug use: No       Family History:   Family History   Problem Relation Name Age of Onset    Cancer Mother      Dementia Sister      Cancer Brother      Diabetes Neg Hx  Gout Neg Hx      SLE Neg Hx      Stroke Neg Hx      Psoriasis Neg Hx      Heart Disease Neg Hx      Kidney Disease Neg Hx      Parkinson's  Neg Hx      Tremor Neg Hx          Allergies:   Allergies   Allergen Reactions    Pcn [Penicillins] UNKNOWN     Pt received cefazolin  11/2018 and 11/2012    Penicillin UNKNOWN         PHYSICAL EXAMINATION:      VITAL SIGNS:   Vitals:    09/28/23 1022   BP: 136/75   BP Source: Arm, Right Upper   Patient Position: Sitting   Pulse: 75       The last dosage of medication was 14 hours ago.    NEUROLOGICAL EXAM:    Mental Status: Alert and oriented x 3, fluent speech, comprehension intact, normal attention, adequate fund of knowledge, remote and short term memory intact    Cranial Nerves:  VII: face symmetric  IX, X: uvula/palate midline  XI: SCM and trapezius 5/5, shoulder shrug symmetric  XII: tongue midline    Motor:  Bulk: Normal bulk throughout  Tone: As per UPDRS below    Movement Examination:  No dyskinesia  No dystonia  See UPDRS below    Gait:  Moderately decreased stride length and Normal stance width.  Reduced left and Reduced right arm swing.  Left and Right re-emergent upper extremity tremor.    UPDRS Part III Motor Examination:    Speech: 3 - Marked impairment, difficult to understand.  Facial Expression: 2 - Slight but definitely abnormal diminution of facial expression.  Tremor at Rest Jaw: 0 - Absent.  Tremor at Rest RUE: 2 - Moderate in amplitude, but only intermittently present.  Tremor at Rest LUE: 2 - Moderate in amplitude, but only intermittently present.  Tremor at Rest RLE: 1 - Slight and infrequently present.  Tremor at Rest LLE: 1 - Slight and infrequently present.  Action of Postural Tremor of Hands R: 1 - Slight and infrequent.  Action of Postural Tremor of Hands L: 1 - Slight and infrequent.  Rigidity NECK: 0 - Absent  Rigidity RUE: 2 - Mild to moderate.  Rigidity LUE: 2 - Mild to moderate.  Rigidity RLE: 2 - Mild to moderate  Rigidity LLE: 2 - Mild to moderate.  Finger Taps R: 3 -  Severely impaired. Frequent hesitation in initiating movement or arrests in ongoing movement. (3-6/5 sec)  Finger Taps L: 3 - Severely impaired. Frequent hesitation in initiating movement or arrests in ongoing movement. (3-6/5 sec)  Hand Movements R: 2 - Moderately impaired. Definite and early fatiguing. May have occasional arrests in movement.  Hand Movements L: 2 - Moderately impaired. Definite and early fatiguing. May have occasional arrests in movement.  Rapid Alternating Movement of Hands R: 2 - Moderately impaired. Definite and early fatiguing. May have occasional arrests in movement.  Rapid Alternating Movements of Hands L: 2 - Moderately impaired. Definite and early fatiguing. May have occasional arrests in movement.  Leg Agility with Knee Bent R: 3 - Severely impaired. Frequent hesitation in initiating movement or arrests in ongoing movement.  Leg Agility with Knee Bent L: 3 - Severely impaired. Frequent hesitation in initiating movement or arrests in ongoing movement.    DBS Monopolar Review:  Device:  [employer]  Target: Bilateral STN DBS  Lead placement date: 03/02/23  Last battery placement: 03/02/23  Date of Monopolar Review: 09/28/2023  Individual impedances were checked and were within normal limits.    DBS initial settings and usage:    Program 1:     -  Left STN: 2- C+ (0-3.21mA) 2.36mA PW 60 Freq 130  Right STN: 2- C+ (0-3.20mA) 2.63mA PW 60 Freq 130    Program 2:    - Initial DBS setting  Left STN: 1- 3+ (0-3.78mA) 2.94mA PW 60 Freq 130  Right STN: 1- 3+ (0-3.61mA) 2.72mA PW 60 Freq 130    Program 3:      Left STN: 1- 3+ (0-2.74mA) 1.41mA PW 60 Freq 130  Right STN: 1- 3+ (0-2.17mA) 1.43mA PW 60 Freq 130    All were somewhat helpful but program 2 was most helpful and most consistent.    DBS final settings and usage:    Program 1:     - Final and safe setting (prior program 2, initial DBS setting)  Left STN: 1- 3+ (0-3.19mA) 2.41mA PW 60 Freq 130  Right STN: 1- 3+ (0-3.66mA) 2.74mA PW 60 Freq 130    Program 2:    - Alternative setting     Left STN: 1- 3+ (50%) C+ (50%) (0-3.51mA) 2.65mA PW 60 Freq 130  Right STN: 1- 3+ (70%) C+ (30%) (0-3.51mA) 2.51mA PW 60 Freq 130 with subprogram 2- C+ (0-1.78mA) 0.21mA PW 60 Freq 104    Program 3:    - Alternative setting     Left STN: 1- 3+ (0-3.10mA) 2.68mA PW 60 Freq 130 with subprogram 2- C+ 2.71mA (0-2.82mA) PW 60 Freq 104  Right STN: 1- 2- 3+ (0-3.12mA) 3.57mA PW 60 Freq 130    Program 4:    - Alternative setting  Left STN: 1- 3+ (0-3.6mA) 2.48mA PW 60 Freq 130  Right STN: 1- 3+ (0-3.9mA) 2.31mA PW 60 Freq 130 with subprogram 2- C+ (0-1.60mA) 0.68mA PW 60 Freq 104    DBS Programming Strategy:  Made DBS programs based off best program 2 to try which seemed to further help tremor.    Assessment/Plan:    Mitchell Burnett is a 73 y.o. 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 DBS helps with tremor but there has been some progressive symptoms over the past few weeks. He has some fading of efficacy reported with some DBS programs and there may be a component of stimulation induced side effects including capsular pulling, speech difficulties, gait difficulties and dysphagia.     I have given him new DBS settings to try which is attempting to balance tremor control with reducing stimulation induced side effects.    We will continue to remain off carbidopa /levodopa  as previously he had some hallucinations which worsened with carbidopa /levodopa .    For sialorrhea I suggested botulinum toxin injections for sialorrhea.    We discussed Parkinson's disease in detail, including management options and prognosis. I discussed the importance of exercise and advised regular physical activity and further education on this.      Problem List Items Addressed This Visit          NEURO    Parkinson disease (CMS-HCC) - Primary    Cognitive impairment    S/P deep brain stimulator placement       - New DBS settings as above  - Continue melatonin 5mg  qHS  - Botulinum  toxin injections for sialorrhea - Xeomin 50 units    To return to clinic 2-3 months for additional DBS programming.       Ne[facility] charges:  Total Programming Time:  15 minutes  [id number] - eval of stimulator with programming - face to face first 15 minutes    8 to 22 minutes - [id number] x 1    Refugio Vandevoorde Loong Rupert Graces, MD, FRCPC  [facility]    [facility]

## 2023-09-28 NOTE — Telephone Encounter
 Pt's wife called to schedule pt for a procedure visit with Dr. Mamie. Pt scheduled for 10/05/23 at 1:20 PM.

## 2023-10-03 NOTE — Patient Instructions
 Thank you for choosing to let us  care for you today.  Please read through the following information that will help us  continue to provide the best care possible.     -- Preferred method of communication is through [employer], if the issue cannot wait until your next scheduled follow up.   -- MyChart may be used for non-emergent communication. Emails are not reviewed after hours or over the weekend/holidays/after 4PM. Staff will reply to your email within 24-48 business hours.       -- If you do not hear from us  within one week of a lab or imaging study being completed, please call/send my chart email to the office to be sure that we have received the results. This is especially challenging when tests are done outside of the [facility] system, as many times results do not make it back to our office for a variety of reasons. In our office no news is good news does not apply. You should hear from us  with results for each test.  [facility] [place] lab/imaging results:  Due to the CARES act, results automatically release to MyChart.  Dr. Mamie will continue to send you a result note on any labs that he orders.  With these changes you may see your results before Dr. Mamie does.   Please allow up to 72 hours for review and response to your results.     -- If you are having acute (new/sudden onset) or severe/worsening neurologic symptoms, please call 911 or seek care in ED.    -- For scheduling of IMAGING/RADIOLOGY, please call [phone] at your convenience to schedule your studies.  -- For referrals placed during the visit, if you have not heard from scheduling within one week, please call the call center at [phone] to get scheduling assistance.  -- For refills on medications, please first contact your pharmacy, who will fax a refill authorization request form to our office.  Weekdays only. Allow up to 2 business days for refills. Please plan ahead, as refills will not be filled after hours.    -- Our scheduling staff may be reached at [phone] opt. 1 for scheduling needs.     --Dr Laban nurse Delon may be contacted at [phone] for urgent needs. Staff will return your call within 24 business hours.     For Appointments:   -- Please try to arrive early for your appointment time to help facilitate your visit. 15 minutes early is recommended.   -- If you are late to your appointment, we reserve the right to ask you to reschedule or wait until next available time to be seen in fairness to other patients scheduled that day.   -- There are times when we are running behind in clinic. Our goal is to always be on time, however, there are time when unexpected events occur with patients, which may cause a delay. We appreciate your understanding when this occurs.

## 2023-10-05 ENCOUNTER — Ambulatory Visit: Admit: 2023-10-05 | Discharge: 2023-10-06 | Payer: MEDICARE

## 2023-10-05 ENCOUNTER — Encounter: Admit: 2023-10-05 | Discharge: 2023-10-05 | Payer: MEDICARE

## 2023-10-05 MED ORDER — INCOBOTULINUMTOXINA 50 UNIT IM SOLR
50 [IU] | Freq: Once | INTRAMUSCULAR | 0 refills | Status: CP
Start: 2023-10-05 — End: ?
  Administered 2023-10-05: 19:00:00 50 [IU] via INTRAMUSCULAR

## 2023-10-05 NOTE — Procedures
 Reason for visit:  Mitchell Burnett is 73 y.o. male who returns to clinic for botulinum toxin injection for sialorrhea.      Consent form was signed.  Most common side effects: neck pain, headache, migraine, muscular weakness, musculoskeletal stiffness, injection-site pain, musculoskeletal pain, myalgia, facial paresis, hypertension, muscle spasms; infection at injections sites, bruising, bleeding, dysphagia, dryness of mouth    Confirmed: patient, procedure, side, site, safety procedures followed.     Performed by: Lonne Lovett Rupert Mamie, MD  Preparation: no contraindications noted to Botulinum toxin, possible medications prior to procedure: topical numbing cream prior to procedure, preparation of site with alcohol    VITAL SIGNS:   Vitals:    10/05/23 1311   BP: 119/72   BP Source: Arm, Left Upper   Pulse: 69   Temp: 36.1 ?C (97 ?F)   PainSc: Zero   Weight: 70.8 kg (156 lb)   Height: 177.8 cm (5' 10)       Procedure:  We did time out procedure and confirmed patient identity and procedure planned, including target of injection.  After informed consent was obtained, using aseptic technique, the following glands were injected with Incobotulinum toxin type A (Xeomin ):           Right Parotid gland- 15 units  Left Parotid gland- 15 units    Right Submandibular gland- 10 units  Left Submandibular gland- 10 units       Impression and Plan:  Mitchell Burnett is a 73 y.o. male patient with sialorrhea.  A total of 50 units of Incobotulinum toxin type A (Xeomin ); 0 units were wasted out of a total of 50 units.  The procedure was tolerated well without complication.  He will return to clinic in three months' time for repeat injections.    Counseled: Patient/Family, Regarding diagnosis, Regarding treatment, Regarding medications. If any serious side effects occur, the patient has been instructed to go to the nearest emergency room and call our office.    Mitchell Truszkowski Loong Rupert Mamie, MD, FRCPC  [facility]  [facility]

## 2023-10-06 DIAGNOSIS — K117 Disturbances of salivary secretion: Principal | ICD-10-CM

## 2023-11-04 ENCOUNTER — Encounter: Admit: 2023-11-04 | Discharge: 2023-11-04 | Payer: MEDICARE

## 2023-11-16 ENCOUNTER — Encounter: Admit: 2023-11-16 | Discharge: 2023-11-16 | Payer: MEDICARE

## 2023-11-16 DIAGNOSIS — K117 Disturbances of salivary secretion: Principal | ICD-10-CM

## 2023-11-22 ENCOUNTER — Encounter: Admit: 2023-11-22 | Discharge: 2023-11-22 | Payer: MEDICARE

## 2023-11-25 ENCOUNTER — Encounter: Admit: 2023-11-25 | Discharge: 2023-11-25 | Payer: MEDICARE

## 2023-11-25 ENCOUNTER — Ambulatory Visit: Admit: 2023-11-25 | Discharge: 2023-11-26 | Payer: MEDICARE

## 2023-11-30 ENCOUNTER — Encounter: Admit: 2023-11-30 | Discharge: 2023-11-30 | Payer: MEDICARE

## 2023-11-30 ENCOUNTER — Ambulatory Visit: Admit: 2023-11-30 | Discharge: 2023-12-01 | Payer: MEDICARE

## 2023-12-01 ENCOUNTER — Encounter: Admit: 2023-12-01 | Discharge: 2023-12-01 | Payer: MEDICARE

## 2023-12-06 ENCOUNTER — Encounter: Admit: 2023-12-06 | Discharge: 2023-12-06 | Payer: MEDICARE

## 2023-12-19 ENCOUNTER — Encounter: Admit: 2023-12-19 | Discharge: 2023-12-19 | Payer: MEDICARE

## 2023-12-19 NOTE — Telephone Encounter [36]
 Called pt to schedule an earlier appt because Dr. Mamie was double booked. Appt changed to 11/19 at 12:30. Pt verbalizes understanding.

## 2023-12-21 ENCOUNTER — Encounter: Admit: 2023-12-21 | Discharge: 2023-12-21 | Payer: MEDICARE

## 2023-12-21 NOTE — Patient Instructions [37]
 Thank you for choosing to let us  care for you today.  Please read through the following information that will help us  continue to provide the best care possible.     -- Preferred method of communication is through [employer], if the issue cannot wait until your next scheduled follow up.   -- MyChart may be used for non-emergent communication. Emails are not reviewed after hours or over the weekend/holidays/after 4PM. Staff will reply to your email within 24-48 business hours.       -- If you do not hear from us  within one week of a lab or imaging study being completed, please call/send my chart email to the office to be sure that we have received the results. This is especially challenging when tests are done outside of the [facility] system, as many times results do not make it back to our office for a variety of reasons. In our office no news is good news does not apply. You should hear from us  with results for each test.  [place] [facility] lab/imaging results:  Due to the CARES act, results automatically release to MyChart.  Dr. Mamie will continue to send you a result note on any labs that he orders.  With these changes you may see your results before Dr. Mamie does.   Please allow up to 72 hours for review and response to your results.     -- If you are having acute (new/sudden onset) or severe/worsening neurologic symptoms, please call 911 or seek care in ED.    -- For scheduling of IMAGING/RADIOLOGY, please call [phone] at your convenience to schedule your studies.  -- For referrals placed during the visit, if you have not heard from scheduling within one week, please call the call center at [phone] to get scheduling assistance.  -- For refills on medications, please first contact your pharmacy, who will fax a refill authorization request form to our office.  Weekdays only. Allow up to 2 business days for refills. Please plan ahead, as refills will not be filled after hours.    -- Our scheduling staff may be reached at [phone] opt. 1 for scheduling needs.     --Dr Laban nurse Elzie may be contacted at [phone] for urgent needs. Staff will return your call within 24 business hours.     For Appointments:   -- Please try to arrive early for your appointment time to help facilitate your visit. 15 minutes early is recommended.   -- If you are late to your appointment, we reserve the right to ask you to reschedule or wait until next available time to be seen in fairness to other patients scheduled that day.   -- There are times when we are running behind in clinic. Our goal is to always be on time, however, there are time when unexpected events occur with patients, which may cause a delay. We appreciate your understanding when this occurs.

## 2023-12-23 ENCOUNTER — Encounter: Admit: 2023-12-23 | Discharge: 2023-12-23 | Payer: MEDICARE

## 2023-12-28 ENCOUNTER — Ambulatory Visit: Admit: 2023-12-28 | Discharge: 2023-12-29 | Payer: MEDICARE

## 2023-12-28 ENCOUNTER — Encounter: Admit: 2023-12-28 | Discharge: 2023-12-28 | Payer: MEDICARE

## 2023-12-28 ENCOUNTER — Ambulatory Visit: Admit: 2023-12-28 | Discharge: 2023-12-28 | Payer: MEDICARE

## 2023-12-28 VITALS — BP 115/69 | HR 61 | Ht 70.0 in | Wt 136.0 lb

## 2023-12-28 DIAGNOSIS — K117 Disturbances of salivary secretion: Secondary | ICD-10-CM

## 2023-12-28 DIAGNOSIS — G20A2 Parkinson's disease without dyskinesia, with fluctuating manifestations (CMS-HCC): Principal | ICD-10-CM

## 2023-12-28 DIAGNOSIS — Z9689 Presence of other specified functional implants: Secondary | ICD-10-CM

## 2023-12-28 MED ORDER — INCOBOTULINUMTOXINA 50 UNIT IM SOLR
50 [IU] | Freq: Once | INTRAMUSCULAR | 0 refills | Status: CP
Start: 2023-12-28 — End: ?
  Administered 2023-12-28: 20:00:00 50 [IU] via INTRAMUSCULAR

## 2023-12-28 NOTE — Procedures [3]
 Reason for visit:  Mitchell Burnett is 73 y.o. male who returns to clinic for botulinum toxin injection for sialorrhea.      Consent form was signed.  Most common side effects: neck pain, headache, migraine, muscular weakness, musculoskeletal stiffness, injection-site pain, musculoskeletal pain, myalgia, facial paresis, hypertension, muscle spasms; infection at injections sites, bruising, bleeding, dysphagia, dryness of mouth    Confirmed: patient, procedure, side, site, safety procedures followed.     Performed by: Lonne Lovett Rupert Mamie, MD  Preparation: no contraindications noted to Botulinum toxin, possible medications prior to procedure: topical numbing cream prior to procedure, preparation of site with alcohol    VITAL SIGNS:   There were no vitals filed for this visit.      Procedure:  We did time out procedure and confirmed patient identity and procedure planned, including target of injection.  After informed consent was obtained, using aseptic technique, the following glands were injected with Incobotulinum toxin type A (Xeomin ):           Right Parotid gland- 15 units  Left Parotid gland- 15 units    Right Submandibular gland- 10 units  Left Submandibular gland- 10 units       Impression and Plan:  Mitchell Burnett is a 73 y.o. male patient with sialorrhea.  A total of 50 units of Incobotulinum toxin type A (Xeomin ); 0 units were wasted out of a total of 50 units.  The procedure was tolerated well without complication.  He will return to clinic in three months' time for repeat injections.    Counseled: Patient/Family, Regarding diagnosis, Regarding treatment, Regarding medications. If any serious side effects occur, the patient has been instructed to go to the nearest emergency room and call our office.    Mitchell Matthies Loong Rupert Mamie, MD, FRCPC  [facility]  [facility]

## 2023-12-28 NOTE — Progress Notes [1]
 Reason for visit:  Mitchell Burnett is 73 y.o. 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 Accompanied by: wife and son who also aids in providing history.    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 He initially saw Dr. Jacklynn in 2013 but was diagnosed with PD in 2017 after he developed right sided bradykinesia and rigidity. He was placed on carbidopa /levodopa . He was also on benztropine .    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 Despite this we discussed moving forward with bilateral STN DBS. He had bilateral STN DBS on 03/02/23 by Dr. Burke.    Plan at the last visit:  - New DBS settings as above  - Restart carbidopa /levodopa  CR 25/100mg  1 tablet TID  - Continue melatonin 5mg  qHS  - Botulinum toxin injections for sialorrhea - Xeomin  50 units    He has noticed decreased effectiveness of his current medication regimen and DBS settings over the past few weeks, particularly affecting the left side. He takes one medication once daily in the morning and another three times a day, with the next dose due soon. Tremors, especially in the left hand, are significant and impact his ability to use a computer mouse or phone. He finds programs one and three on his device most effective, while program four is occasionally beneficial. Program two is ineffective.    No hallucinations have occurred since restarting carbidopa -levodopa , though he recalls one instance of visual misperception due to a reflection. Drooling persists, initially helped by injections, but effectiveness has waned. Saliva runs out when he opens his mouth to eat. He experiences intermittent tightness and tingling on the left side. Speech is sometimes muffled, and articulation of thoughts is delayed.    Current medications related to present condition:   8:30am 1-2pm 10pm     CD/LD CR  25/100 mg 1 1 1      Melatonin 5mg  qHS    Prior Medications:  Donepezil  - stopped 10/18/22 due to insomnia   Benztropine  0.5mg  BID - stopped after DBS improved tremor  carbidopa /levodopa  IR 25/100mg  1 tablet TID during the day makes him a zombie and cognitive difficulties  carbidopa /levodopa  IR 25/100mg  1 tablet at 10pm - stopped due to possible hallucinations.  Stopped atropine drops - possible hallucinations and confusion and also weren't effective      Medications:   Current Outpatient Medications on File Prior to Visit   Medication Sig Dispense Refill    acetaminophen  (TYLENOL ) 325 mg tablet Take two tablets by mouth every 4 hours as needed. Indications: pain      allopurinoL  (ZYLOPRIM ) 100 mg tablet Take one tablet by mouth at bedtime daily. Take with food.      Apple Cider Vinegar 300 mg tab Take one tablet by mouth daily as needed.      atropine (ISOPTO ATROPINE) 1 % ophthalmic solution Place two drops under tongue three times daily. For sialorrhea, I suggested atropine 1% ophthalmic solution to use 2-3 drops sublingually BID-TID. 15 mL 3    calcium carbonate (TUMS) 500 mg (200 mg elemental calcium) chewable tablet Chew one tablet by mouth daily as needed.      carbidopa -levodopa  CR (SINEMET  CR) 25/100 mg tablet Take one tablet by mouth three times daily. 90 tablet 5    ibuprofen (ADVIL) 200 mg tablet Take one tablet  by mouth every 6 hours as needed for Pain. Take with food.      melatonin 5 mg chew Chew one tablet by mouth at bedtime daily.      rosuvastatin  (CRESTOR ) 10 mg tablet Take one tablet by mouth at bedtime daily.      vibegron (GEMTESA) 75 mg tablet Take one tablet by mouth daily.       No current facility-administered medications on file prior to visit.       Past Medical History:    Past Medical History:    Abnormal involuntary movement    Arthritis    BPH (benign prostatic hyperplasia)    Cataract    HLD (hyperlipidemia)    Hypertension    Movement disorder    Parkinson disease (CMS-HCC)    Vision decreased        Social History:    Social History     Socioeconomic History    Marital status: Married   Tobacco Use    Smoking status: Never    Smokeless tobacco: Never   Vaping Use    Vaping status: Never Used   Substance and Sexual Activity    Alcohol use: Yes     Alcohol/week: 3.0 standard drinks of alcohol     Types: 3 Cans of beer per week     Comment: 2-3 drinks per week    Drug use: No       Family History:   Family History   Problem Relation Name Age of Onset    Cancer Mother      Dementia Sister      Cancer Brother      Diabetes Neg Hx      Gout Neg Hx      SLE Neg Hx      Stroke Neg Hx      Psoriasis Neg Hx      Heart Disease Neg Hx      Kidney Disease Neg Hx      Parkinson's  Neg Hx      Tremor Neg Hx          Allergies:   Allergies   Allergen Reactions    Pcn [Penicillins] UNKNOWN     Pt received cefazolin  11/2018 and 11/2012    Penicillin UNKNOWN         PHYSICAL EXAMINATION:      VITAL SIGNS:   Vitals:    12/28/23 1252   BP: 115/69   BP Source: Arm, Left Upper   Patient Position: Sitting   Pulse: 61     Last dose: 4.5 hours ago.    NEUROLOGICAL EXAM:    Mental Status: Alert and oriented x 3, fluent speech, comprehension intact, normal attention, adequate fund of knowledge, remote and short term memory intact    Cranial Nerves:  VII: face symmetric  IX, X: uvula/palate midline  XI: SCM and trapezius 5/5, shoulder shrug symmetric  XII: tongue midline    Motor:  Bulk: Normal bulk throughout  Tone: As per UPDRS below    Movement Examination:  No dyskinesia  No dystonia  See UPDRS below    Gait:  Moderately decreased stride length and Normal stance width.  Reduced left and Reduced right arm swing.  Left and Right re-emergent upper extremity tremor.    UPDRS Part III Motor Examination:    Speech: 2 - Monotone, slurred but understandable- moderately impaired.  Facial Expression: 2 - Slight but definitely abnormal diminution of facial expression.  Tremor at Rest  Jaw: 0 - Absent.  Tremor at Rest RUE: 1 - Slight and infrequently present.  Tremor at Rest LUE: 2 - Moderate in amplitude, but only intermittently present.  Tremor at Rest RLE: 0 - Absent  Tremor at Rest LLE: 0 - Absent  Action of Postural Tremor of Hands R: 0 - Absent  Action of Postural Tremor of Hands L: 0 - Absent  Rigidity NECK: 2 - Mild to moderate.  Rigidity RUE: 2 - Mild to moderate.  Rigidity LUE: 2 - Mild to moderate.  Rigidity RLE: 2 - Mild to moderate  Rigidity LLE: 3 - Marked, but full range of motion easily achieved.  Finger Taps R: 3 -  Severely impaired. Frequent hesitation in initiating movement or arrests in ongoing movement. (3-6/5 sec)  Finger Taps L: 3 - Severely impaired. Frequent hesitation in initiating movement or arrests in ongoing movement. (3-6/5 sec)  Hand Movements R: 2 - Moderately impaired. Definite and early fatiguing. May have occasional arrests in movement.  Hand Movements L: 2 - Moderately impaired. Definite and early fatiguing. May have occasional arrests in movement.  Rapid Alternating Movement of Hands R: 1 - Mild slowing and/or reduction in amplitude.  Rapid Alternating Movements of Hands L: 1 - Mild slowing and/or reduction in amplitude.  Leg Agility with Knee Bent R: 3 - Severely impaired. Frequent hesitation in initiating movement or arrests in ongoing movement.  Leg Agility with Knee Bent L: 3 - Severely impaired. Frequent hesitation in initiating movement or arrests in ongoing movement.  Arising From Chair: 3 - Tends to fall back and may have to try more than one time, but can get up without help.  Posture: 3 - Severely stooped posture with kyphosis- can be moderately leaning to one side.  Gait: 3 - Severe disturbance of gait, requiring assistance.  Postural Stability: 3 - Very unstable, tends to lose balance spontaneously.  Body Bradykinesia and Hypokinesia: 2 - Mild degree of slowness and poverty of movement which is definitely abnormal. Alternatively, some reduced amplitude.  Total Motor Exam: 50    DBS Monopolar Review:  Device: [employer]  Target: Bilateral STN DBS  Lead placement date: 03/02/23  Last battery placement: 03/02/23  Individual impedances were checked and were within normal limits.    DBS initial settings and usage:    Program 1:     - Alternative setting  Left STN: 1- 3+ (50%) C+ (50%) (0-3.48mA) 2.45mA PW 60 Freq 130  Right STN: 1- 3+ (70%) C+ (30%) (0-3.66mA) 2.61mA PW 60 Freq 130 with subprogram 2- 4+ (60%) C+ (40%) (0-2.37mA) 2.50mA PW 60 Freq 104    Program 2:    - Prior safe setting    Left STN: 1- 3+ (50%) C+ (50%) (0-3.42mA) 2.18mA PW 60 Freq 130  Right STN: 1- 3+ (70%) C+ (30%) (0-3.66mA) 2.81mA PW 60 Freq 130 with subprogram 2- C+ (0-1.67mA) 0.101mA PW 60 Freq 104    Program 3:    - Initial setting   Left STN: 1- 3+ (0-3.38mA) 3.55mA PW 60 Freq 130 with subprogram 2- C+ 2.76mA (0-2.3mA) PW 60 Freq 104  Right STN: 1- (20%) 2- (80%) 3+ (20%) C+ (80%) (0-3.27mA) 3.60mA PW 60 Freq 130    Program 4:  Left STN: 1- 3+ (0-3.74mA) 2.54mA PW 60 Freq 139 with subprogram 2- C+ 2.106mA (0-3.72mA) PW 60 Freq 104  Right STN: 1- (20%) 2- (80%) 3+ (0-3.69mA) 2.42mA PW 60 Freq 104 with subprogram 2- C+ 2.18mA (0-2.30mA) PW 60 Freq 139  Program 2 was not effective (prior safe setting).  Program 3 was the best followed by program 1.   Program 4 had intermittent good tremor control.    DBS final settings and usage:    Program 1:     - Final setting  Left STN: 1- 3+ (50%) C+ (50%) (0-3.35mA) 2.37mA PW 60 Freq 130  Right STN: 1- 3+ (70%) C+ (30%) (0-4.26mA) 3.99mA PW 60 Freq 130 with subprogram 2- 4+ (60%) C+ (40%) (0-3.75mA) 2.90mA PW 60 Freq 104    Program 2:    - New alternative setting    Left STN: 1- 3+ (0-3.81mA) 3.63mA PW 60 Freq 130 with subprogram 2- C+ 2.65mA (0-2.20mA) PW 60 Freq 104  Right STN: 1- (5%) 2- (65%) 3- (30%) C+ (80%) (0-4.74mA) 3.2mA PW 60 Freq 130    Program 3:    - Initial and Safe setting   Left STN: 1- 3+ (0-3.47mA) 3.109mA PW 60 Freq 130 with subprogram 2- C+ 2.63mA (0-2.13mA) PW 60 Freq 104  Right STN: 1- (20%) 2- (80%) C+ (0-3.24mA) 3.21mA PW 60 Freq 130    Program 4:    - New alternative setting  Left STN: 1- 3+ (0-3.70mA) 3.81mA PW 60 Freq 139 with subprogram 2- C+ 2.52mA (0-3.39mA) PW 60 Freq 104  Right STN: 1- (20%) 2- (80%) 3+ (0-3.83mA) 2.93mA PW 60 Freq 104 with subprogram 2- C+ 3.52mA (0-3.40mA) PW 60 Freq 139    DBS Programming Strategy:  Made DBS programs based off best program 3 to try which seemed to further help tremor for left side - mostly made adjustments to R STN. He has some capsular side effects with higher amounts of stimulation but contact 2- does help with tremor significantly.     Assessment/Plan:    Mitchell Burnett is a 73 y.o. 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 DBS significantly helps tremor bilaterally compared to when DBS is OFF. However, they report increased slowness, stiffness and tremor, specifically tremor on the left side. I suggested trying new DBS programs as above, mostly changing his R STN settings for left hand tremor.     We will continue with the same carbidopa /levodopa  dose for now, but may consider increasing in the future.    For sialorrhea we will continue with botulinum toxin injections (Xeomin  50 units) as scheduled. See separate procedure note.    We discussed Parkinson's disease in detail, including management options and prognosis. I discussed the importance of exercise and advised regular physical activity and further education on this.      Problem List Items Addressed This Visit          ENT    Sialorrhea       NEURO    Parkinson disease (CMS-HCC) - Primary    S/P deep brain stimulator placement     - New DBS settings as above  - Continue carbidopa /levodopa  CR 25/100mg  1 tablet TID  - Continue melatonin 5mg  qHS  - Continue botulinum toxin injections for sialorrhea - Xeomin  50 units - see separate procedure note from today    To return to clinic 2-3 months for additional DBS programming.       Neurostim[facility] Services charges:  Total Programming Time:  15 minutes  [id number] - eval of stimulator with programming - face to face first 15 minutes    8 to 22 minutes - [id number] x 1    Kynsie Falkner Loong Kelvin  Khalil Szczepanik, MD, FRCPC  [facility]  [facility]

## 2024-01-04 ENCOUNTER — Encounter: Admit: 2024-01-04 | Discharge: 2024-01-04 | Payer: MEDICARE

## 2024-01-17 ENCOUNTER — Encounter: Admit: 2024-01-17 | Discharge: 2024-01-17 | Payer: MEDICARE

## 2024-02-10 ENCOUNTER — Encounter: Admit: 2024-02-10 | Discharge: 2024-02-10 | Payer: MEDICARE

## 2024-02-16 ENCOUNTER — Encounter: Admit: 2024-02-16 | Discharge: 2024-02-16 | Payer: MEDICARE

## 2024-02-17 ENCOUNTER — Encounter: Admit: 2024-02-17 | Discharge: 2024-02-17 | Payer: MEDICARE

## 2024-02-29 ENCOUNTER — Encounter: Admit: 2024-02-29 | Discharge: 2024-02-29 | Payer: MEDICARE

## 2024-03-02 ENCOUNTER — Encounter: Admit: 2024-03-02 | Discharge: 2024-03-02 | Payer: MEDICARE

## 2024-03-02 ENCOUNTER — Ambulatory Visit: Admit: 2024-03-02 | Discharge: 2024-03-03 | Payer: MEDICARE

## 2024-03-02 VITALS — Ht 70.0 in | Wt 135.0 lb

## 2024-03-02 DIAGNOSIS — Z9689 Presence of other specified functional implants: Secondary | ICD-10-CM

## 2024-03-02 DIAGNOSIS — K117 Disturbances of salivary secretion: Principal | ICD-10-CM

## 2024-03-02 NOTE — Telephone Encounter [36]
-----   Message from MARLA Graces, MD sent at 03/02/2024 12:39 PM CST -----  Hi Rickelle Sylvestre and [place]    For 04/04/24 can you schedule a 12:30-1pm DBS 30 minute programming session prior to his botulinum toxin injections that day? I know it is at 1:20pm. Are you able to swap his 1:20pm appointment with the 1pm appointment? Thank you.    Thank you.    Ka Loong Rupert Graces, MD, FRCPC  [facility]  [facility]

## 2024-03-02 NOTE — Progress Notes [1]
 Reason for visit:  Mitchell Burnett is 74 y.o. 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 Accompanied by: wife and son who also aids in providing history.    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 He initially saw Dr. Jacklynn in 2013 but was diagnosed with PD in 2017 after he developed right sided bradykinesia and rigidity. He was placed on carbidopa /levodopa . He was also on benztropine .    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 Despite this we discussed moving forward with bilateral STN DBS. He had bilateral STN DBS on 03/02/23 by Dr. Burke.    Plan at the last visit:  - New DBS settings as above  - Continue carbidopa /levodopa  CR 25/100mg  1 tablet TID  - Continue melatonin 5mg  qHS  - Continue botulinum toxin injections for sialorrhea - Xeomin  50 units - see separate procedure note from today    He reports incremental improvement in mobility over the past year with ongoing physical therapy, now able to ambulate at home at times without a walker, though limitations persist. Program four is currently used, and the patient and family report it helps with tremor and mobility, but there is intermittent right shoulder and arm tightness. Attempts to adjust DBS settings to reduce tightness were made, but the patient and family report that he returned to the previous program after these adjustments. Program three was previously used and considered helpful, while programs one and two were not preferred by the patient and family. He experienced a significant worsening of symptoms on January 3rd due to a very low DBS battery, which resolved after charging the device.    He continues to experience stiffness and slowness, which affect his speech and facial muscles. Speech is strained, slow, and slurred, with difficulty articulating words. He has increased coughing during meals, sometimes leading to choking, with family noting that certain foods and increased saliva contribute. Sialorrhea persists; prior Xeomin  (incobotulinumtoxinA ) injections for drooling provided only partial and short-lived benefit. He is interested in increasing the Xeomin  dosage at future treatments.      Current medications related to present condition:   8:30am 1-2pm 10pm     CD/LD CR  25/100 mg 1 1 1      Melatonin 5mg  qHS    Prior Medications:  Donepezil  - stopped 10/18/22 due to insomnia   Benztropine  0.5mg  BID - stopped after DBS improved tremor  carbidopa /levodopa  IR 25/100mg  1 tablet TID during the day makes him a zombie and cognitive difficulties  carbidopa /levodopa  IR 25/100mg  1 tablet at 10pm - stopped due to possible hallucinations.  Stopped atropine drops - possible hallucinations and confusion and also weren't effective      Medications:   Current Outpatient Medications on File Prior to Visit   Medication Sig Dispense Refill    acetaminophen  (TYLENOL ) 325 mg tablet Take two tablets by mouth every 4 hours as needed. Indications: pain      allopurinoL  (ZYLOPRIM ) 100 mg tablet Take one tablet by mouth at bedtime daily. Take with food.      Apple Cider Vinegar 300 mg tab Take one tablet by mouth daily as needed.      atropine (ISOPTO ATROPINE) 1 % ophthalmic solution Place two drops under tongue three times daily. For sialorrhea, I suggested atropine 1% ophthalmic solution to use 2-3 drops sublingually  BID-TID. (Patient not taking: Reported on 03/02/2024) 15 mL 3    calcium carbonate (TUMS) 500 mg (200 mg elemental calcium) chewable tablet Chew one tablet by mouth daily as needed.      carbidopa -levodopa  CR (SINEMET  CR) 25/100 mg tablet Take one tablet by mouth three times daily. 90 tablet 5    ibuprofen (ADVIL) 200 mg tablet Take one tablet by mouth every 6 hours as needed for Pain. Take with food.      melatonin 5 mg chew Chew one tablet by mouth at bedtime daily.      rosuvastatin  (CRESTOR ) 10 mg tablet Take one tablet by mouth at bedtime daily.      vibegron (GEMTESA) 75 mg tablet Take one tablet by mouth daily.       No current facility-administered medications on file prior to visit.       Past Medical History:    Past Medical History:    Abnormal involuntary movement    Arthritis    BPH (benign prostatic hyperplasia)    Cataract    HLD (hyperlipidemia)    Hypertension    Movement disorder    Parkinson disease (CMS-HCC)    Vision decreased        Social History:    Social History     Socioeconomic History    Marital status: Married   Tobacco Use    Smoking status: Never    Smokeless tobacco: Never   Vaping Use    Vaping status: Never Used   Substance and Sexual Activity    Alcohol use: Yes     Alcohol/week: 3.0 standard drinks of alcohol     Types: 3 Cans of beer per week     Comment: 2-3 drinks per week    Drug use: No       Family History:   Family History   Problem Relation Name Age of Onset    Cancer Mother      Dementia Sister      Cancer Brother      Diabetes Neg Hx      Gout Neg Hx      SLE Neg Hx      Stroke Neg Hx      Psoriasis Neg Hx      Heart Disease Neg Hx      Kidney Disease Neg Hx      Parkinson's  Neg Hx      Tremor Neg Hx          Allergies:   Allergies   Allergen Reactions    Pcn [Penicillins] UNKNOWN     Pt received cefazolin  11/2018 and 11/2012    Penicillin UNKNOWN         PHYSICAL EXAMINATION:      VITAL SIGNS:   Vitals:    03/02/24 1144   BP: Comment: unable to get a reading   BP Source: Arm, Left Upper   Patient Position: Standing     Last dose: 14 hours ago.    NEUROLOGICAL EXAM:    Mental Status: Alert and oriented x 3, fluent speech, comprehension intact, normal attention, adequate fund of knowledge, remote and short term memory intact    Cranial Nerves:  VII: face symmetric  XI: SCM and trapezius 5/5, shoulder shrug symmetric  XII: tongue midline    Motor:  Bulk: Normal bulk throughout  Tone: As per UPDRS below    Movement Examination:  No dyskinesia  No dystonia  See UPDRS below    Gait:  Not examined    UPDRS Part III Motor Examination:    Speech: 3 - Marked impairment, difficult to understand.  Facial Expression: 2 - Slight but definitely abnormal diminution of facial expression.  Tremor at Rest Jaw: 0 - Absent.  Tremor at Rest RUE: 0 - Absent  Tremor at Rest LUE: 2 - Moderate in amplitude, but only intermittently present.  Tremor at Rest RLE: 0 - Absent  Tremor at Rest LLE: 1 - Slight and infrequently present.  Action of Postural Tremor of Hands R: 0 - Absent  Action of Postural Tremor of Hands L: 0 - Absent  Rigidity NECK: 3 - Marked, but full range of motion easily achieved.  Rigidity RUE: 2 - Mild to moderate.  Rigidity LUE: 2 - Mild to moderate.  Rigidity RLE: 2 - Mild to moderate  Rigidity LLE: 3 - Marked, but full range of motion easily achieved.  Finger Taps R: 3 -  Severely impaired. Frequent hesitation in initiating movement or arrests in ongoing movement. (3-6/5 sec)  Finger Taps L: 3 - Severely impaired. Frequent hesitation in initiating movement or arrests in ongoing movement. (3-6/5 sec)  Hand Movements R: 2 - Moderately impaired. Definite and early fatiguing. May have occasional arrests in movement.  Hand Movements L: 2 - Moderately impaired. Definite and early fatiguing. May have occasional arrests in movement.  Rapid Alternating Movement of Hands R: 2 - Moderately impaired. Definite and early fatiguing. May have occasional arrests in movement.  Rapid Alternating Movements of Hands L: 1 - Mild slowing and/or reduction in amplitude.  Leg Agility with Knee Bent R: 2 - Moderately impaired. Definite and early fatiguing.  Leg Agility with Knee Bent L: 2 - Moderately impaired. Definite and early fatiguing.  Arising From Chair: 2 - Pushes self up from arms of seat.  Posture: 3 - Severely stooped posture with kyphosis- can be moderately leaning to one side.  Gait: 3 - Severe disturbance of gait, requiring assistance.  Postural Stability: 3 - Very unstable, tends to lose balance spontaneously.  Body Bradykinesia and Hypokinesia: 2 - Mild degree of slowness and poverty of movement which is definitely abnormal. Alternatively, some reduced amplitude.  Total Motor Exam: 50    DBS Monopolar Review:  Device: [employer]  Target: Bilateral STN DBS  Lead placement date: 03/02/23  Last battery placement: 03/02/23  Individual impedances were checked and were within normal limits.    DBS initial settings and usage:    Program 1:  Left STN: 1- 3+ (50%) C+ (50%) (0-3.22mA) 2.13mA PW 60 Freq 130  Right STN: 1- 3+ (70%) C+ (30%) (0-4.42mA) 3.58mA PW 60 Freq 130 with subprogram 2- 4+ (60%) C+ (40%) (0-3.67mA) 2.67mA PW 60 Freq 104    Program 2:  Left STN: 1- 3+ (0-3.66mA) 3.51mA PW 60 Freq 130 with subprogram 2- C+ 2.61mA (0-2.52mA) PW 60 Freq 104  Right STN: 1- (5%) 2- (65%) 3- (30%) C+ (80%) (0-4.68mA) 3.63mA PW 60 Freq 130    Program 3:    - Previous Safe setting   Left STN: 1- 3+ (0-3.36mA) 3.27mA PW 60 Freq 130 with subprogram 2- C+ 2.18mA (0-2.41mA) PW 60 Freq 104  Right STN: 1- (20%) 2- (80%) C+ (0-3.28mA) 3.67mA PW 60 Freq 130    Program 4:    - Initial setting  Left STN: 1- 3+ (0-3.51mA) 3.65mA PW 60 Freq 139 with subprogram 2- C+ 2.83mA (0-3.95mA) PW 60 Freq 104  Right STN: 1- (20%) 2- (80%) 3+ (0-3.65mA) 2.7mA PW  60 Freq 104 with subprogram 2- C+ 3.80mA (0-3.36mA) PW 60 Freq 139    Program 4 was the best but causes some right arm tightness.  Program 4 compared to 3 helps with mobility, not using walker as much and helps with tremor.  Program 3 was second best.    DBS final settings and usage:    Program 1:     - New alternative setting   Left STN: 1- 2A+ (20%) 3+ (80%) (0-4.91mA) 3.20mA PW 60 Freq 139 with subprogram 2C- (50%) 2B- (50%) 2A+ (10%) C+ (90%) 3.88mA (0-3.64mA) PW 60 Freq 104  Right STN: 2C- (50%) 2B- (50%) 3+ (0-4.60mA) 4.1mA PW 60 Freq 104 with subprogram 2- C+ 3.87mA (0-3.73mA) PW 60 Freq 139    Program 2:    - New alternative setting    Left STN: 1- 2A+ (20%) 3+ (80%) (0-4.59mA) 3.70mA PW 60 Freq 139 with subprogram 2C- (50%) 2B- (50%) 2A+ (10%) C+ (90%) 3.18mA (0-3.29mA) PW 60 Freq 104  Right STN: 1- (20%) 2- (80%) 3+ (0-3.19mA) 2.22mA PW 60 Freq 104 with subprogram 2- C+ 3.89mA (0-3.59mA) PW 60 Freq 139    Program 3:    - New alternative setting   Left STN: 1- 3+ (0-3.35mA) 3.37mA PW 60 Freq 130 with subprogram 2- C+ 2.91mA (0-2.32mA) PW 60 Freq 104  Right STN: 1- (10%) 2- (90%) 3+ (10%) C+ (90%) (0-4.49mA) 4.30mA PW 60 Freq 130    Program 4:    -  Initial and Final Safe setting   Left STN: 1- 3+ (0-3.29mA) 3.38mA PW 60 Freq 139 with subprogram 2- C+ 2.77mA (0-3.31mA) PW 60 Freq 104  Right STN: 1- (20%) 2- (80%) 3+ (0-3.89mA) 2.60mA PW 60 Freq 104 with subprogram 2- C+ 3.72mA (0-3.56mA) PW 60 Freq 139    DBS Programming Strategy:  Made DBS programs based off best program 4 to try which seemed to further help tremor for left side. This also appeared to help with overall bradykinesia and parkinsonism without worsening any capsular pulling. They will try the new programs 1-3 for 5-7 days each.  Assessment/Plan:    Mitchell Burnett is a 74 y.o. 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 DBS significantly helps tremor bilaterally compared to when DBS is OFF. However, they report increased slowness, stiffness and tremor, specifically tremor on the left side.     I have given new DBS settings as above that appeared to help with tremor and PD symptoms.    We will continue with the same carbidopa /levodopa  dose for now, but may consider increasing in the future.    For sialorrhea we will continue with botulinum toxin injections and increase to 100 units of Xeomin .    For dysarthria and some dysphagia I placed an order for speech therapy.    We discussed Parkinson's disease in detail, including management options and prognosis. I discussed the importance of exercise and advised regular physical activity and further education on this.      Problem List Items Addressed This Visit          ENT    Sialorrhea    Relevant Orders    AMB REFERRAL TO SPEECH THERAPY       NEURO    Parkinson disease (CMS-HCC) - Primary    Relevant Orders    AMB REFERRAL TO SPEECH THERAPY    S/P deep brain stimulator placement    Relevant Orders  AMB REFERRAL TO SPEECH THERAPY       - New DBS settings as above  - Continue carbidopa /levodopa  CR 25/100mg  1 tablet TID  - Continue melatonin 5mg  qHS  - Continue botulinum toxin injections for sialorrhea - increase to 100U of Xeomin  at next visit  - Speech therapy Mitchell Burnett)    To return to clinic 1-2 months for additional DBS programming.       Neurostimulator Services charges:  Total Programming Time:  15 minutes  [id number] - eval of stimulator with programming - face to face first 15 minutes    8 to 22 minutes - [id number] x 1    Mitchell Matney Loong Rupert Graces, MD, FRCPC  [facility]  [facility]

## 2024-03-03 DIAGNOSIS — G20A2 Parkinson's disease without dyskinesia, with fluctuations (CMS-HCC): Principal | ICD-10-CM

## 2024-03-08 ENCOUNTER — Encounter: Admit: 2024-03-08 | Discharge: 2024-03-08 | Payer: MEDICARE

## 2024-03-09 ENCOUNTER — Encounter: Admit: 2024-03-09 | Discharge: 2024-03-09 | Payer: MEDICARE

## 2024-03-09 ENCOUNTER — Ambulatory Visit: Admit: 2024-03-09 | Discharge: 2024-03-10 | Payer: MEDICARE
